# Patient Record
Sex: Female | Born: 1980 | Race: Asian | Hispanic: No | Marital: Married | Smoking: Never smoker
Health system: Southern US, Community
[De-identification: ages and names within clinical notes are randomized; demographics above are authoritative.]

## PROBLEM LIST (undated history)

## (undated) DIAGNOSIS — E039 Hypothyroidism, unspecified: Secondary | ICD-10-CM

## (undated) DIAGNOSIS — T7840XA Allergy, unspecified, initial encounter: Secondary | ICD-10-CM

## (undated) DIAGNOSIS — K219 Gastro-esophageal reflux disease without esophagitis: Secondary | ICD-10-CM

## (undated) HISTORY — DX: Allergy, unspecified, initial encounter: T78.40XA

## (undated) HISTORY — DX: Gastro-esophageal reflux disease without esophagitis: K21.9

## (undated) HISTORY — DX: Hypothyroidism, unspecified: E03.9

---

## 2011-05-28 LAB — HM PAP SMEAR

## 2013-09-07 ENCOUNTER — Other Ambulatory Visit: Payer: BC Managed Care – PPO

## 2013-09-07 ENCOUNTER — Ambulatory Visit (INDEPENDENT_AMBULATORY_CARE_PROVIDER_SITE_OTHER): Payer: BC Managed Care – PPO | Admitting: Internal Medicine

## 2013-09-07 ENCOUNTER — Encounter: Payer: Self-pay | Admitting: Internal Medicine

## 2013-09-07 VITALS — BP 112/70 | HR 66 | Temp 98.0°F | Resp 14 | Ht 64.0 in | Wt 147.0 lb

## 2013-09-07 DIAGNOSIS — Z0000 Encounter for general adult medical examination without abnormal findings: Secondary | ICD-10-CM | POA: Insufficient documentation

## 2013-09-07 DIAGNOSIS — Z124 Encounter for screening for malignant neoplasm of cervix: Secondary | ICD-10-CM

## 2013-09-07 DIAGNOSIS — E039 Hypothyroidism, unspecified: Secondary | ICD-10-CM

## 2013-09-07 NOTE — Progress Notes (Signed)
Patient ID: Colleen Henderson, female   DOB: 17-Feb-1981, 33 y.o.   MRN: [id number]    Chief Complaint  Patient presents with  . Establish Care    new patient, physical   No Known Allergies  HPI 33 y/o female patient is here to establish care. She was living in [place] and getting her medical care there. She has not seen a doctor in the [place] and is due for a physical exam. She has not had a pap smear in > 1 year She has been having hair loss and this concerns her During pregnancy patient has hypothyroidism noted and was on levothyroxine  Review of Systems  Constitutional: Negative for fever, chills, weight loss, malaise/fatigue and diaphoresis.  HENT: Negative for congestion, hearing loss and sore throat.   Eyes: Negative for blurred vision, double vision and discharge.  Respiratory: Negative for cough, sputum production, shortness of breath and wheezing.   Cardiovascular: Negative for chest pain, palpitations, orthopnea and leg swelling.  Gastrointestinal: Negative for heartburn, nausea, vomiting, abdominal pain, diarrhea and constipation.  Genitourinary: Negative for dysuria, urgency, frequency and flank pain.  Musculoskeletal: Negative for back pain, falls, joint pain and myalgias.  Skin: Negative for itching and rash.  Neurological: Negative for dizziness, tingling, focal weakness and headaches.  Psychiatric/Behavioral: Negative for depression and memory loss. The patient is not nervous/anxious.    History reviewed. No pertinent past medical history.  History reviewed. No pertinent past surgical history.  Family History  Problem Relation Age of Onset  . Diabetes Mother   . Diabetes Father    Not on any medications  History   Social History  . Marital Status: Married    Spouse Name: N/A    Number of Children: N/A  . Years of Education: N/A   Occupational History  . Not on file.   Social History Main Topics  . Smoking status: Never Smoker   . Smokeless tobacco: Never Used    . Alcohol Use: No  . Drug Use: No  . Sexual Activity: Not on file   Other Topics Concern  . Not on file   Social History Narrative  . No narrative on file   Physical exam BP 112/70  Pulse 66  Temp(Src) 98 F (36.7 C) (Oral)  Resp 14  Ht 5\' 4"  (1.626 m)  Wt 147 lb (66.679 kg)  BMI 25.22 kg/m2  SpO2 99%  LMP 08/08/2013  General- elderly female in no acute distress Head- atraumatic, normocephalic Eyes- PERRLA, EOMI, no pallor, no icterus, no discharge Ears- left ear normal tympanic membrane and normal external ear canal , right ear normal tympanic membrane and normal external ear canal Neck- no lymphadenopathy, no thyromegaly, no jugular vein distension, no carotid bruit Nose- normal nasaal mucosa, no maxillary sinus tenderness, no frontal sinus tenderness Mouth- normal mucus membrane, no oral thrush, normal oropharynx Chest- no chest wall deformities, no chest wall tenderness Breast- no masses, no palpable lumps, normal nipple and areola exam, no axillary lymphadenopathy Cardiovascular- normal s1,s2, no murmurs/ rubs/ gallops, normal distal pulses Respiratory- bilateral clear to auscultation, no wheeze, no rhonchi, no crackles Abdomen- bowel sounds present, soft, non tender, no organomegaly, no abdominal bruits, no guarding or rigidity, no CVA tenderness Pelvic exam- normal pelvic exam Musculoskeletal- able to move all 4 extremities, no spinal and paraspinal tenderness, steady gait, normal range of motion, no leg edema Neurological- no focal deficit, normal reflexes, normal muscle strength, normal sensation to fine touch and vibration Skin- warm and dry Psychiatry- alert and  oriented to person, place and time, normal mood and affect  Labs- 09-24-12 tsh 7.256, hb 12.5, plt 388  Assessment/plan  1. Pap smear for cervical cancer screening - PAP, IG w/ Reflex HPV when ASCU [[facility], [facility]]  2. Routine general medical examination at a health care facility Normal overall  exam. Routine labs ordered. the patient was counseled regarding regular self-examination of the breasts on a monthly basis, prevention of dental and periodontal disease, diet, regular sustained exercise for at least 30 minutes 5 times per week, the proper use of sunscreen and protective clothing. - CMP; Future - CBC with Differential; Future - Lipid Panel; Future  3. Unspecified hypothyroidism Check thyroid level prior to next visit - TSH; Future

## 2013-09-09 LAB — PAP IG W/ RFLX HPV ASCU: PAP SMEAR COMMENT: 0

## 2013-09-13 ENCOUNTER — Other Ambulatory Visit: Payer: BC Managed Care – PPO

## 2013-09-13 DIAGNOSIS — E039 Hypothyroidism, unspecified: Secondary | ICD-10-CM

## 2013-09-13 DIAGNOSIS — Z0000 Encounter for general adult medical examination without abnormal findings: Secondary | ICD-10-CM

## 2013-09-14 LAB — COMPREHENSIVE METABOLIC PANEL
A/G RATIO: 2 (ref 1.1–2.5)
ALT: 19 IU/L (ref 0–32)
AST: 19 IU/L (ref 0–40)
Albumin: 4.7 g/dL (ref 3.5–5.5)
Alkaline Phosphatase: 92 IU/L (ref 39–117)
BUN/Creatinine Ratio: 11 (ref 8–20)
BUN: 8 mg/dL (ref 6–20)
CHLORIDE: 102 mmol/L (ref 97–108)
CO2: 24 mmol/L (ref 18–29)
Calcium: 10 mg/dL (ref 8.7–10.2)
Creatinine, Ser: 0.75 mg/dL (ref 0.57–1.00)
GFR calc Af Amer: 122 mL/min/{1.73_m2} (ref >59)
GFR calc non Af Amer: 106 mL/min/{1.73_m2} (ref >59)
GLUCOSE: 90 mg/dL (ref 65–99)
Globulin, Total: 2.3 g/dL (ref 1.5–4.5)
POTASSIUM: 5.4 mmol/L — AB (ref 3.5–5.2)
SODIUM: 143 mmol/L (ref 134–144)
TOTAL PROTEIN: 7 g/dL (ref 6.0–8.5)
Total Bilirubin: <0.2 mg/dL (ref 0.0–1.2)

## 2013-09-14 LAB — CBC WITH DIFFERENTIAL/PLATELET
BASOS ABS: 0 10*3/uL (ref 0.0–0.2)
Basos: 0 %
EOS: 2 %
Eosinophils Absolute: 0.1 10*3/uL (ref 0.0–0.4)
HEMATOCRIT: 39.7 % (ref 34.0–46.6)
Hemoglobin: 13 g/dL (ref 11.1–15.9)
IMMATURE GRANULOCYTES: 0 %
Immature Grans (Abs): 0 10*3/uL (ref 0.0–0.1)
LYMPHS ABS: 2.1 10*3/uL (ref 0.7–3.1)
LYMPHS: 37 %
MCH: 28.1 pg (ref 26.6–33.0)
MCHC: 32.7 g/dL (ref 31.5–35.7)
MCV: 86 fL (ref 79–97)
MONOCYTES: 12 %
Monocytes Absolute: 0.7 10*3/uL (ref 0.1–0.9)
Neutrophils Absolute: 2.8 10*3/uL (ref 1.4–7.0)
Neutrophils Relative %: 49 %
RBC: 4.63 x10E6/uL (ref 3.77–5.28)
RDW: 13.5 % (ref 12.3–15.4)
WBC: 5.7 10*3/uL (ref 3.4–10.8)

## 2013-09-14 LAB — LIPID PANEL
CHOLESTEROL TOTAL: 130 mg/dL (ref 100–199)
Chol/HDL Ratio: 3.2 ratio units (ref 0.0–4.4)
HDL: 41 mg/dL (ref >39)
LDL Calculated: 74 mg/dL (ref 0–99)
TRIGLYCERIDES: 75 mg/dL (ref 0–149)
VLDL Cholesterol Cal: 15 mg/dL (ref 5–40)

## 2013-09-14 LAB — TSH: TSH: 12.57 u[IU]/mL — ABNORMAL HIGH (ref 0.450–4.500)

## 2013-09-16 ENCOUNTER — Encounter: Payer: Self-pay | Admitting: *Deleted

## 2013-09-22 ENCOUNTER — Ambulatory Visit (INDEPENDENT_AMBULATORY_CARE_PROVIDER_SITE_OTHER): Payer: BC Managed Care – PPO | Admitting: Internal Medicine

## 2013-09-22 ENCOUNTER — Encounter: Payer: Self-pay | Admitting: Internal Medicine

## 2013-09-22 VITALS — BP 104/60 | HR 61 | Temp 97.8°F | Wt 146.6 lb

## 2013-09-22 DIAGNOSIS — E039 Hypothyroidism, unspecified: Secondary | ICD-10-CM

## 2013-09-22 HISTORY — DX: Hypothyroidism, unspecified: E03.9

## 2013-09-22 MED ORDER — LEVOTHYROXINE SODIUM 50 MCG PO TABS: 50.0000 ug | ORAL_TABLET | Freq: Every day | ORAL | Status: DC

## 2013-09-22 NOTE — Progress Notes (Signed)
Patient ID: Colleen Henderson, female   DOB: [birth year], 33 y.o.   MRN: [id number]    Chief Complaint  Patient presents with  . Follow-up    f/u & discuss thyroid labs   No Known Allergies  HPI 33 y/o female patient is here for follow up on her thyroid lab. Her routine lab showed her tsh to be > 12 With normal t3 and t4. She has been having low energy level bit no constipation. She does not exercise on regular basis. Has been having hair loss but no other concerns  ROS No fever or chills No nausea or vomiting No abdominal pain, diarrhea or constipation Has hx of hypothyroidism during pregnancy where she was on levothyroxine 75 mcg and it was stopped post delivery for unclear reason  History reviewed. No pertinent past medical history. No current outpatient prescriptions on file prior to visit.   No current facility-administered medications on file prior to visit.   History reviewed. No pertinent past surgical history.  Physical exam BP 104/60  Pulse 61  Temp(Src) 97.8 F (36.6 C) (Oral)  Wt 146 lb 9.6 oz (66.497 kg)  SpO2 99%  LMP 08/24/2013  gen- adult female in NAD HEENT- no palpable enlargement of thyroid gland, no lymphadenopathy cvs- ns 1,s2, rrr respi- CTAB abdo- bowel sound present, soft, non tender Ext- able to move all 4, adequate strength, no edema  Labs- Lab Results  Component Value Date   TSH 12.570* 09/13/2013   T3TOTAL 116 09/13/2013   T4TOTAL 7.4 09/13/2013   Assessment/plan  1. Hypothyroidism Will start her on levothyroxine 50 mcg daily for now and recheck thyroid panel in 6- 8 weeks. - Thyroid Panel With TSH; Future - total and free t4 and t3 with TPO

## 2013-09-23 ENCOUNTER — Other Ambulatory Visit: Payer: Self-pay | Admitting: *Deleted

## 2013-09-23 MED ORDER — LEVOTHYROXINE SODIUM 50 MCG PO TABS: ORAL_TABLET | ORAL | Status: DC

## 2013-09-29 LAB — SPECIMEN STATUS REPORT

## 2013-09-30 LAB — T3: T3 TOTAL: 116 ng/dL (ref 71–180)

## 2013-09-30 LAB — SPECIMEN STATUS REPORT

## 2013-09-30 LAB — T4: T4 TOTAL: 7.4 ug/dL (ref 4.5–12.0)

## 2013-11-23 ENCOUNTER — Other Ambulatory Visit: Payer: BC Managed Care – PPO

## 2013-11-30 ENCOUNTER — Other Ambulatory Visit: Payer: BC Managed Care – PPO

## 2013-11-30 DIAGNOSIS — E039 Hypothyroidism, unspecified: Secondary | ICD-10-CM

## 2013-12-01 LAB — THYROID PANEL WITH TSH
FREE THYROXINE INDEX: 2.7 (ref 1.2–4.9)
T3 Uptake Ratio: 28 % (ref 24–39)
T4 TOTAL: 9.5 ug/dL (ref 4.5–12.0)
TSH: 5.18 u[IU]/mL — AB (ref 0.450–4.500)

## 2013-12-01 LAB — THYROID PEROXIDASE ANTIBODY: Thyroid Peroxidase Ab: 255 IU/mL — ABNORMAL HIGH (ref 0–34)

## 2013-12-01 LAB — T4, FREE: Free T4: 1.53 ng/dL (ref 0.82–1.77)

## 2013-12-01 LAB — T3, FREE: T3, Free: 3.3 pg/mL (ref 2.0–4.4)

## 2013-12-01 LAB — T3: T3, Total: 95 ng/dL (ref 71–180)

## 2013-12-07 ENCOUNTER — Encounter: Payer: Self-pay | Admitting: *Deleted

## 2014-07-19 ENCOUNTER — Encounter: Payer: Self-pay | Admitting: Internal Medicine

## 2014-09-13 ENCOUNTER — Encounter: Payer: BC Managed Care – PPO | Admitting: Internal Medicine

## 2014-09-23 ENCOUNTER — Encounter: Payer: Self-pay | Admitting: Internal Medicine

## 2014-09-30 ENCOUNTER — Encounter: Payer: Self-pay | Admitting: Internal Medicine

## 2014-10-26 ENCOUNTER — Ambulatory Visit (INDEPENDENT_AMBULATORY_CARE_PROVIDER_SITE_OTHER): Payer: PRIVATE HEALTH INSURANCE | Admitting: Internal Medicine

## 2014-10-26 ENCOUNTER — Encounter: Payer: Self-pay | Admitting: Internal Medicine

## 2014-10-26 ENCOUNTER — Other Ambulatory Visit: Payer: Self-pay

## 2014-10-26 VITALS — BP 110/80 | HR 68 | Temp 97.5°F | Resp 18 | Ht 64.0 in | Wt 138.0 lb

## 2014-10-26 DIAGNOSIS — Z0000 Encounter for general adult medical examination without abnormal findings: Secondary | ICD-10-CM | POA: Diagnosis not present

## 2014-10-26 DIAGNOSIS — E034 Atrophy of thyroid (acquired): Secondary | ICD-10-CM

## 2014-10-26 DIAGNOSIS — R2231 Localized swelling, mass and lump, right upper limb: Secondary | ICD-10-CM | POA: Diagnosis not present

## 2014-10-26 DIAGNOSIS — E038 Other specified hypothyroidism: Secondary | ICD-10-CM

## 2014-10-26 NOTE — Progress Notes (Signed)
Patient ID: Colleen Henderson, female   DOB: 07/07/1980, 34 y.o.   MRN: [id number]    Location:    [facility]   Place of Service:   OFFICE   Advanced Directive information  NONE  Chief Complaint  Patient presents with  . Annual Exam    Annaul exam    HPI:  34 yo female seen today for annual exam. She has no concerns. Plans to travel to [place] this weekend for 6 weeks. no further issues with hair loss. Daughter and spouse present today.  No thyroid sx's. Taking synthroid as Rx but ran out last week. Needs new rx   History reviewed. No pertinent past medical history.  History reviewed. No pertinent past surgical history.  Patient Care Team: Oneal GroutMahima Pandey, MD as PCP - General (Internal Medicine)  History   Social History  . Marital Status: Married    Spouse Name: N/A  . Number of Children: N/A  . Years of Education: N/A   Occupational History  . Not on file.   Social History Main Topics  . Smoking status: Never Smoker   . Smokeless tobacco: Never Used  . Alcohol Use: No  . Drug Use: No  . Sexual Activity: Not on file   Other Topics Concern  . Not on file   Social History Narrative     reports that she has never smoked. She has never used smokeless tobacco. She reports that she does not drink alcohol or use illicit drugs.  Family History  Problem Relation Age of Onset  . Diabetes Mother   . Diabetes Father    Family Status  Relation Status Death Age  . Mother Alive   . Father Alive   . Sister Alive   . Daughter Alive   . Daughter Alive     Immunization History  Administered Date(s) Administered  . Tdap 11/24/2012    No Known Allergies  Medications: Patient's Medications  New Prescriptions   No medications on file  Previous Medications   LEVOTHYROXINE (SYNTHROID, LEVOTHROID) 50 MCG TABLET    Take one tablet by mouth once daily 30 minutes before breakfast for thyroid  Modified Medications   No medications on file  Discontinued Medications   No  medications on file    Review of Systems  Constitutional: Negative for fever, chills, diaphoresis, activity change, appetite change and fatigue.  HENT: Negative for ear pain and sore throat.   Eyes: Negative for visual disturbance.  Respiratory: Negative for cough, chest tightness and shortness of breath.   Cardiovascular: Negative for chest pain, palpitations and leg swelling.  Gastrointestinal: Negative for nausea, vomiting, abdominal pain, diarrhea, constipation and blood in stool.  Endocrine: Negative for cold intolerance, heat intolerance, polydipsia, polyphagia and polyuria.  Genitourinary: Negative for dysuria, menstrual problem and pelvic pain.  Musculoskeletal: Negative for arthralgias.  Skin: Negative for rash.       Right axillary lump that has increased in size x 2 yrs. Began shortly after delivery of child  Neurological: Negative for dizziness, tremors, numbness and headaches.  Psychiatric/Behavioral: Negative for sleep disturbance. The patient is not nervous/anxious.     Filed Vitals:   10/26/14 0946  BP: 110/80  Pulse: 68  Temp: 97.5 F (36.4 C)  TempSrc: Oral  Resp: 18  Height: 5\' 4"  (1.626 m)  Weight: 138 lb (62.596 kg)  SpO2: 99%   Body mass index is 23.68 kg/(m^2).  Physical Exam  Constitutional: She is oriented to person, place, and time. She appears well-developed and  well-nourished. No distress.  HENT:  Head: Normocephalic and atraumatic.  Right Ear: Hearing, tympanic membrane, external ear and ear canal normal.  Left Ear: Hearing, tympanic membrane, external ear and ear canal normal.  Mouth/Throat: Uvula is midline, oropharynx is clear and moist and mucous membranes are normal. She does not have dentures.  Eyes: Conjunctivae, EOM and lids are normal. Pupils are equal, round, and reactive to light. No scleral icterus.  Neck: Trachea normal and normal range of motion. Neck supple. Carotid bruit is not present. No thyroid mass and no thyromegaly present.    Cardiovascular: Normal rate, regular rhythm, normal heart sounds and intact distal pulses.  Exam reveals no gallop and no friction rub.   No murmur heard. No carotid bruit b/l. No LE edema b/l. No calf TTP.   Pulmonary/Chest: Effort normal and breath sounds normal. She has no wheezes. She has no rhonchi. She has no rales. Right breast exhibits no inverted nipple, no mass, no nipple discharge, no skin change and no tenderness. Left breast exhibits no inverted nipple, no mass, no nipple discharge, no skin change and no tenderness. Breasts are symmetrical.    Abdominal: Soft. Normal appearance, normal aorta and bowel sounds are normal. She exhibits no pulsatile midline mass and no mass. There is no hepatosplenomegaly. There is no tenderness. There is no rigidity, no rebound and no guarding. No hernia.  Genitourinary: No breast swelling, tenderness, discharge or bleeding.  Musculoskeletal: Normal range of motion.  Lymphadenopathy:       Head (right side): No posterior auricular adenopathy present.       Head (left side): No posterior auricular adenopathy present.    She has no cervical adenopathy.       Right: No supraclavicular adenopathy present.       Left: No supraclavicular adenopathy present.  Neurological: She is alert and oriented to person, place, and time. She has normal strength and normal reflexes. No cranial nerve deficit. Gait normal.  Skin: Skin is warm, dry and intact. No rash noted. Nails show no clubbing.  Psychiatric: She has a normal mood and affect. Her speech is normal and behavior is normal. Thought content normal. Cognition and memory are normal.     Labs reviewed: No visits with results within 3 Month(s) from this visit. Latest known visit with results is:  Appointment on 11/30/2013  Component Date Value Ref Range Status  . TSH 11/30/2013 5.180* 0.450 - 4.500 uIU/mL Final  . T4, Total 11/30/2013 9.5  4.5 - 12.0 ug/dL Final  . T3 Uptake Ratio 11/30/2013 28  24 - 39  % Final  . Free Thyroxine Index 11/30/2013 2.7  1.2 - 4.9 Final  . T3, Total 11/30/2013 95  71 - 180 ng/dL Final  . T3, Free 16/02/9603 3.3  2.0 - 4.4 pg/mL Final  . Free T4 11/30/2013 1.53  0.82 - 1.77 ng/dL Final  . Thyroid Peroxidase Ab 11/30/2013 255* 0 - 34 IU/mL Final    No results found.   Assessment/Plan   ICD-9-CM ICD-10-CM   1. Routine general medical examination at a health care facility V70.0 Z00.00 CBC with Differential     CMP     Lipid Panel     Urinalysis with Reflex Microscopic  2. Axillary lump, right - needs further w/u 782.2 R22.31 US BREAST LTD UNI RIGHT INC AXILLA     MM DIAG BREAST TOMO BILATERAL  3. Hypothyroidism due to acquired atrophy of thyroid 244.8 E03.8 TSH   246.8 E03.4 T4, Free  Pt is UTD on health maintenance. Vaccinations are UTD. Pt maintains a healthy lifestyle. Encouraged pt to exercise 30-45 minutes 4-5 times per week. Eat a well balanced diet. Avoid smoking. Limit alcohol intake. Wear seatbelt when riding in the car. Wear sun block (SPF >50) when spending extended times outside.  Will call with Ultrasound appointment  Follow up in 1 year for CPE  [facility] [address]sey  [facility] [address] [place], [place] [address] [phone] Cell (Monday-Friday 8 AM - 5 PM) [phone] After 5 PM and follow prompts

## 2014-10-26 NOTE — Patient Instructions (Signed)
Encouraged her to exercise 30-45 minutes 4-5 times per week. Eat a well balanced diet. Avoid smoking. Limit alcohol intake. Wear seatbelt when riding in the car. Wear sun block (SPF >50) when spending extended times outside.   Will call with Ultrasound appointment  Follow up in 1 year for CPE

## 2014-10-27 ENCOUNTER — Other Ambulatory Visit: Payer: Self-pay | Admitting: *Deleted

## 2014-10-27 ENCOUNTER — Encounter: Payer: Self-pay | Admitting: *Deleted

## 2014-10-27 DIAGNOSIS — E039 Hypothyroidism, unspecified: Secondary | ICD-10-CM

## 2014-10-27 LAB — CBC WITH DIFFERENTIAL/PLATELET
Basophils Absolute: 0 10*3/uL (ref 0.0–0.2)
Basos: 0 %
EOS (ABSOLUTE): 0.1 10*3/uL (ref 0.0–0.4)
Eos: 2 %
HEMOGLOBIN: 11.8 g/dL (ref 11.1–15.9)
Hematocrit: 36.2 % (ref 34.0–46.6)
IMMATURE GRANS (ABS): 0 10*3/uL (ref 0.0–0.1)
Immature Granulocytes: 0 %
Lymphocytes Absolute: 2.4 10*3/uL (ref 0.7–3.1)
Lymphs: 39 %
MCH: 27.8 pg (ref 26.6–33.0)
MCHC: 32.6 g/dL (ref 31.5–35.7)
MCV: 85 fL (ref 79–97)
Monocytes Absolute: 0.3 10*3/uL (ref 0.1–0.9)
Monocytes: 6 %
NEUTROS PCT: 53 %
Neutrophils Absolute: 3.3 10*3/uL (ref 1.4–7.0)
PLATELETS: 415 10*3/uL — AB (ref 150–379)
RBC: 4.24 x10E6/uL (ref 3.77–5.28)
RDW: 14.8 % (ref 12.3–15.4)
WBC: 6.2 10*3/uL (ref 3.4–10.8)

## 2014-10-27 LAB — COMPREHENSIVE METABOLIC PANEL
A/G RATIO: 2.1 (ref 1.1–2.5)
ALT: 14 IU/L (ref 0–32)
AST: 16 IU/L (ref 0–40)
Albumin: 4.6 g/dL (ref 3.5–5.5)
Alkaline Phosphatase: 80 IU/L (ref 39–117)
BUN/Creatinine Ratio: 14 (ref 8–20)
BUN: 9 mg/dL (ref 6–20)
Bilirubin Total: 0.3 mg/dL (ref 0.0–1.2)
CO2: 24 mmol/L (ref 18–29)
CREATININE: 0.63 mg/dL (ref 0.57–1.00)
Calcium: 9.2 mg/dL (ref 8.7–10.2)
Chloride: 101 mmol/L (ref 97–108)
GFR, EST AFRICAN AMERICAN: 136 mL/min/{1.73_m2} (ref >59)
GFR, EST NON AFRICAN AMERICAN: 118 mL/min/{1.73_m2} (ref >59)
Globulin, Total: 2.2 g/dL (ref 1.5–4.5)
Glucose: 86 mg/dL (ref 65–99)
Potassium: 4.5 mmol/L (ref 3.5–5.2)
Sodium: 138 mmol/L (ref 134–144)
Total Protein: 6.8 g/dL (ref 6.0–8.5)

## 2014-10-27 LAB — URINALYSIS, ROUTINE W REFLEX MICROSCOPIC
BILIRUBIN UA: NEGATIVE
Glucose, UA: NEGATIVE
KETONES UA: NEGATIVE
Leukocytes, UA: NEGATIVE
Nitrite, UA: NEGATIVE
PROTEIN UA: NEGATIVE
RBC, UA: NEGATIVE
Specific Gravity, UA: 1.009 (ref 1.005–1.030)
Urobilinogen, Ur: 0.2 mg/dL (ref 0.2–1.0)
pH, UA: 6 (ref 5.0–7.5)

## 2014-10-27 LAB — T4, FREE: FREE T4: 1.06 ng/dL (ref 0.82–1.77)

## 2014-10-27 LAB — LIPID PANEL
CHOL/HDL RATIO: 2.9 ratio (ref 0.0–4.4)
CHOLESTEROL TOTAL: 128 mg/dL (ref 100–199)
HDL: 44 mg/dL (ref >39)
LDL CALC: 69 mg/dL (ref 0–99)
TRIGLYCERIDES: 74 mg/dL (ref 0–149)
VLDL Cholesterol Cal: 15 mg/dL (ref 5–40)

## 2014-10-27 LAB — TSH: TSH: 9.99 u[IU]/mL — AB (ref 0.450–4.500)

## 2014-10-27 MED ORDER — LEVOTHYROXINE SODIUM 50 MCG PO TABS: ORAL_TABLET | ORAL | Status: DC

## 2014-12-06 ENCOUNTER — Encounter: Payer: Self-pay | Admitting: Internal Medicine

## 2014-12-14 ENCOUNTER — Telehealth: Payer: Self-pay | Admitting: Internal Medicine

## 2014-12-14 ENCOUNTER — Encounter: Payer: Self-pay | Admitting: Internal Medicine

## 2014-12-14 NOTE — Telephone Encounter (Signed)
[facility] tried to contact the patient several times to schedule her appointment for her Mammogram & Ultrasound  I have tried calling the patient myself to follow up but her number is now disconnected   Letter sent to patient today

## 2014-12-19 NOTE — Telephone Encounter (Signed)
Noted  

## 2015-11-01 ENCOUNTER — Encounter: Payer: Self-pay | Admitting: Internal Medicine

## 2015-11-01 ENCOUNTER — Ambulatory Visit (INDEPENDENT_AMBULATORY_CARE_PROVIDER_SITE_OTHER): Payer: Managed Care, Other (non HMO) | Admitting: Internal Medicine

## 2015-11-01 VITALS — BP 122/72 | HR 54 | Temp 98.1°F | Ht 64.0 in | Wt 133.6 lb

## 2015-11-01 DIAGNOSIS — E034 Atrophy of thyroid (acquired): Secondary | ICD-10-CM

## 2015-11-01 DIAGNOSIS — Z0000 Encounter for general adult medical examination without abnormal findings: Secondary | ICD-10-CM

## 2015-11-01 DIAGNOSIS — L309 Dermatitis, unspecified: Secondary | ICD-10-CM | POA: Insufficient documentation

## 2015-11-01 DIAGNOSIS — Z124 Encounter for screening for malignant neoplasm of cervix: Secondary | ICD-10-CM | POA: Diagnosis not present

## 2015-11-01 DIAGNOSIS — E038 Other specified hypothyroidism: Secondary | ICD-10-CM | POA: Diagnosis not present

## 2015-11-01 MED ORDER — METHYLPREDNISOLONE ACETATE 40 MG/ML IJ SUSP
40.0000 mg | Freq: Once | INTRAMUSCULAR | Status: AC
  Administered 2015-11-01: 40 mg via INTRAMUSCULAR

## 2015-11-01 MED ORDER — TRIAMCINOLONE ACETONIDE 0.1 % EX LOTN: 1.0000 "application " | TOPICAL_LOTION | Freq: Two times a day (BID) | CUTANEOUS | Status: DC | PRN

## 2015-11-01 MED ORDER — PREDNISONE 10 MG PO TABS: ORAL_TABLET | ORAL | Status: DC

## 2015-11-01 NOTE — Progress Notes (Signed)
Patient ID: Colleen Henderson, female   DOB: 07-13-1980, 35 y.o.   MRN: [id number] Subjective:     Colleen Henderson is a 35 y.o. female and is here for a comprehensive physical exam. The patient reports problems - skin rash x 1 month. Skin is dry and itchy on extremities upper and lower. Applying cetirizine ointment without relief. She has not tried zyrtec or benadryl  Hypothyroid - takes levothyroxine  [facility]. She has menses monthly. No issues. She does not perform self breast exams  Depression screen [facility] 2/9 11/01/2015 09/07/2013  Decreased Interest 0 0  Down, Depressed, Hopeless 0 0  PHQ - 2 Score 0 0    Fall Risk  11/01/2015 10/26/2014  Falls in the past year? No No     Past Medical History  Diagnosis Date  . Hypothyroidism 09/22/2013   History reviewed. No pertinent past surgical history. - denies any surgeries  Social History   Social History  . Marital Status: Married    Spouse Name: N/A  . Number of Children: N/A  . Years of Education: N/A   Occupational History  . Not on file.   Social History Main Topics  . Smoking status: Never Smoker   . Smokeless tobacco: Never Used  . Alcohol Use: No  . Drug Use: No  . Sexual Activity: Not on file   Other Topics Concern  . Not on file   Social History Narrative   Health Maintenance  Topic Date Due  . HIV Screening  08/08/1996  . INFLUENZA VACCINE  12/26/2015  . PAP SMEAR  09/07/2016  . TETANUS/TDAP  11/25/2022    Family History  Problem Relation Age of Onset  . Diabetes Mother   . Diabetes Father     Current Outpatient Prescriptions on File Prior to Visit  Medication Sig Dispense Refill  . levothyroxine (SYNTHROID, LEVOTHROID) 50 MCG tablet Take one tablet by mouth once daily 30 minutes before breakfast for thyroid 90 tablet 3   No current facility-administered medications on file prior to visit.     Review of Systems   Review of Systems  Constitutional: Negative for fever, chills and malaise/fatigue.   HENT: Negative for sore throat and tinnitus.   Eyes: Negative for blurred vision and double vision.  Respiratory: Negative for cough, shortness of breath and wheezing.   Cardiovascular: Negative for chest pain, palpitations, orthopnea and leg swelling.  Gastrointestinal: Negative for heartburn, nausea, vomiting, abdominal pain, diarrhea, constipation and blood in stool.  Genitourinary: Negative for dysuria, urgency, frequency and hematuria.  Musculoskeletal: Negative for myalgias, joint pain and falls.  Skin: Positive for itching and rash.  Neurological: Negative for dizziness, tingling, tremors, sensory change, focal weakness, seizures, loss of consciousness, weakness and headaches.  Endo/Heme/Allergies: Negative for environmental allergies. Does not bruise/bleed easily.       (+) cold intolerance  Psychiatric/Behavioral: Negative for depression and memory loss. The patient is not nervous/anxious and does not have insomnia.      Objective:      Physical Exam  Constitutional: She is oriented to person, place, and time and well-developed, well-nourished, and in no distress.  HENT:  Head: Normocephalic and atraumatic.  Right Ear: External ear normal.  Left Ear: External ear normal.  Mouth/Throat: Oropharynx is clear and moist. No oropharyngeal exudate.  Eyes: Conjunctivae and EOM are normal. Pupils are equal, round, and reactive to light. No scleral icterus.  Neck: Normal range of motion. Neck supple. Carotid bruit is not present. No tracheal deviation  present. Thyromegaly (mild) present.  Cardiovascular: Normal rate, regular rhythm, normal heart sounds and intact distal pulses.  Exam reveals no gallop and no friction rub.   No murmur heard. Pulmonary/Chest: Effort normal and breath sounds normal. She has no wheezes. She has no rhonchi. She has no rales. She exhibits no tenderness. Right breast exhibits no inverted nipple, no mass, no nipple discharge, no skin change and no tenderness.  Left breast exhibits no inverted nipple, no mass, no nipple discharge, no skin change and no tenderness. Breasts are symmetrical.  Abdominal: Soft. Bowel sounds are normal. She exhibits no distension and no mass. There is no hepatosplenomegaly. There is no tenderness. There is no rebound and no guarding.  Genitourinary: Vagina normal, uterus normal, cervix normal, right adnexa normal, left adnexa normal and vulva normal. Rectal exam shows no external hemorrhoid. No vaginal discharge found.  Musculoskeletal: Normal range of motion.  Lymphadenopathy:    She has no cervical adenopathy.    She has no axillary adenopathy.       Right: No supraclavicular adenopathy present.       Left: No supraclavicular adenopathy present.  Neurological: She is alert and oriented to person, place, and time. She has normal reflexes. Gait normal.  Skin: Skin is warm and dry. Rash (dry patchy rash on back, extremities b/l. no vesicular formation. multiple excoriations. ) noted.  Psychiatric: Mood, memory, affect and judgment normal.      Assessment:    Healthy female exam.       ICD-9-CM ICD-10-CM   1. Well adult exam V70.0 Z00.00 CMP     Lipid Panel     Urinalysis with Reflex Microscopic     CBC with Differential/Platelets  2. Hypothyroidism due to acquired atrophy of thyroid 244.8 E03.8 TSH   246.8 E03.4 T4, Free  3. 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 Cervical cancer screening V76.2 Z12.4 PAP, Image Guided [[facility]]     Plan:     See After Visit Summary for Counseling Recommendations   Pt is UTD on health maintenance. Vaccinations are UTD. Pt maintains a healthy lifestyle. Encouraged pt to exercise 30-45 minutes 4-5 times per week. Eat a well balanced diet. Avoid smoking. Limit alcohol intake. Wear seatbelt when riding in the car. Wear sun block (SPF  >50) when spending extended times outside.  Pap smear done today  Once complete prednisone tabs, may use triamcinolone lotion prn to rash  START PREDNISONE TABS IN AM as directed.  Given depo-medrol injection today  Continue other medications as ordered  Will call with lab results  Follow up in 1 yr for CPE and as needed  [facility] [address]  [facility] [address] [place], [place] [address] [phone] Cell (Monday-Friday 8 AM - 5 PM) [phone] After 5 PM and follow prompts

## 2015-11-01 NOTE — Patient Instructions (Signed)
Encouraged her to exercise 30-45 minutes 4-5 times per week. Eat a well balanced diet. Avoid smoking. Limit alcohol intake. Wear seatbelt when riding in the car. Wear sun block (SPF >50) when spending extended times outside.  START PREDNISONE TABS IN AM as directed.  Given depo-medrol injection today  Continue other medications as ordered  Will call with lab results  Follow up in 1 yr for CPE and as needed

## 2015-11-02 ENCOUNTER — Other Ambulatory Visit: Payer: Self-pay

## 2015-11-02 LAB — LIPID PANEL
Chol/HDL Ratio: 2.6 ratio units (ref 0.0–4.4)
Cholesterol, Total: 137 mg/dL (ref 100–199)
HDL: 53 mg/dL (ref >39)
LDL Calculated: 69 mg/dL (ref 0–99)
Triglycerides: 75 mg/dL (ref 0–149)
VLDL Cholesterol Cal: 15 mg/dL (ref 5–40)

## 2015-11-02 LAB — CBC WITH DIFFERENTIAL/PLATELET
BASOS ABS: 0 10*3/uL (ref 0.0–0.2)
BASOS: 0 %
EOS (ABSOLUTE): 0.1 10*3/uL (ref 0.0–0.4)
Eos: 2 %
Hematocrit: 37.1 % (ref 34.0–46.6)
Hemoglobin: 12.5 g/dL (ref 11.1–15.9)
Immature Grans (Abs): 0 10*3/uL (ref 0.0–0.1)
Immature Granulocytes: 0 %
LYMPHS: 41 %
Lymphocytes Absolute: 2.2 10*3/uL (ref 0.7–3.1)
MCH: 28.7 pg (ref 26.6–33.0)
MCHC: 33.7 g/dL (ref 31.5–35.7)
MCV: 85 fL (ref 79–97)
MONOS ABS: 0.5 10*3/uL (ref 0.1–0.9)
Monocytes: 9 %
NEUTROS ABS: 2.5 10*3/uL (ref 1.4–7.0)
NEUTROS PCT: 48 %
PLATELETS: 386 10*3/uL — AB (ref 150–379)
RBC: 4.36 x10E6/uL (ref 3.77–5.28)
RDW: 14.2 % (ref 12.3–15.4)
WBC: 5.2 10*3/uL (ref 3.4–10.8)

## 2015-11-02 LAB — URINALYSIS, ROUTINE W REFLEX MICROSCOPIC
BILIRUBIN UA: NEGATIVE
GLUCOSE, UA: NEGATIVE
KETONES UA: NEGATIVE
Leukocytes, UA: NEGATIVE
NITRITE UA: NEGATIVE
Protein, UA: NEGATIVE
RBC UA: NEGATIVE
SPEC GRAV UA: 1.013 (ref 1.005–1.030)
Urobilinogen, Ur: 0.2 mg/dL (ref 0.2–1.0)
pH, UA: 7 (ref 5.0–7.5)

## 2015-11-02 LAB — COMPREHENSIVE METABOLIC PANEL
ALT: 14 IU/L (ref 0–32)
AST: 18 IU/L (ref 0–40)
Albumin/Globulin Ratio: 1.7 (ref 1.2–2.2)
Albumin: 4.5 g/dL (ref 3.5–5.5)
Alkaline Phosphatase: 65 IU/L (ref 39–117)
BUN/Creatinine Ratio: 11 (ref 9–23)
BUN: 7 mg/dL (ref 6–20)
CHLORIDE: 100 mmol/L (ref 96–106)
CO2: 23 mmol/L (ref 18–29)
Calcium: 9.3 mg/dL (ref 8.7–10.2)
Creatinine, Ser: 0.66 mg/dL (ref 0.57–1.00)
GFR calc Af Amer: 133 mL/min/{1.73_m2} (ref >59)
GFR calc non Af Amer: 116 mL/min/{1.73_m2} (ref >59)
GLOBULIN, TOTAL: 2.7 g/dL (ref 1.5–4.5)
Glucose: 87 mg/dL (ref 65–99)
Potassium: 3.9 mmol/L (ref 3.5–5.2)
SODIUM: 139 mmol/L (ref 134–144)
Total Protein: 7.2 g/dL (ref 6.0–8.5)

## 2015-11-02 LAB — T4, FREE: FREE T4: 1.49 ng/dL (ref 0.82–1.77)

## 2015-11-02 LAB — TSH: TSH: 7.38 u[IU]/mL — ABNORMAL HIGH (ref 0.450–4.500)

## 2015-11-02 MED ORDER — LEVOTHYROXINE SODIUM 75 MCG PO TABS: 75.0000 ug | ORAL_TABLET | Freq: Every day | ORAL | Status: DC

## 2015-11-02 NOTE — Telephone Encounter (Signed)
Medication was increased from 50 mcg to 75 mcg. New Rx was sent to the pharmacy.

## 2015-11-03 LAB — PAP IG (IMAGE GUIDED): PAP Smear Comment: 0

## 2016-04-26 HISTORY — PX: MOUTH SURGERY: SHX715

## 2016-11-01 ENCOUNTER — Encounter: Payer: Managed Care, Other (non HMO) | Admitting: Internal Medicine

## 2016-12-03 ENCOUNTER — Encounter: Payer: Managed Care, Other (non HMO) | Admitting: Internal Medicine

## 2016-12-10 ENCOUNTER — Encounter: Payer: Self-pay | Admitting: Internal Medicine

## 2016-12-10 ENCOUNTER — Ambulatory Visit (INDEPENDENT_AMBULATORY_CARE_PROVIDER_SITE_OTHER): Payer: Managed Care, Other (non HMO) | Admitting: Internal Medicine

## 2016-12-10 VITALS — BP 106/70 | HR 76 | Resp 12 | Ht 64.0 in | Wt 134.0 lb

## 2016-12-10 DIAGNOSIS — R195 Other fecal abnormalities: Secondary | ICD-10-CM | POA: Diagnosis not present

## 2016-12-10 DIAGNOSIS — R1030 Lower abdominal pain, unspecified: Secondary | ICD-10-CM

## 2016-12-10 DIAGNOSIS — L309 Dermatitis, unspecified: Secondary | ICD-10-CM

## 2016-12-10 DIAGNOSIS — Z0000 Encounter for general adult medical examination without abnormal findings: Secondary | ICD-10-CM | POA: Diagnosis not present

## 2016-12-10 DIAGNOSIS — E034 Atrophy of thyroid (acquired): Secondary | ICD-10-CM | POA: Diagnosis not present

## 2016-12-10 MED ORDER — TRIAMCINOLONE ACETONIDE 0.1 % EX LOTN: 1.0000 "application " | TOPICAL_LOTION | Freq: Two times a day (BID) | CUTANEOUS | 1 refills | Status: DC | PRN

## 2016-12-10 NOTE — Patient Instructions (Signed)
Return to office in next 1-2 weeks for fasting labs  Continue current medications as ordered  Follow up in 1 mo for loose stools  Keeping You Healthy  Get These Tests 1. Blood Pressure- Have your blood pressure checked once a year by your health care provider.  Normal blood pressure is 120/80. 2. Weight- Have your body mass index (BMI) calculated to screen for obesity.  BMI is measure of body fat based on height and weight.  You can also calculate your own BMI at https://www.west-esparza.com/www.nhlbisupport.com/bmi/. 3. Cholesterol- Have your cholesterol checked every 5 years starting at age 35 then yearly starting at age 35. 4. Chlamydia, HIV, and other sexually transmitted diseases- Get screened every year until age 35, then within three months of each new sexual provider. 5. Pap Test - Every 1-5 years; discuss with your health care provider. 6. Mammogram- Every 1-2 years starting at age 35--50  Take these medicines  Calcium with Vitamin D-Your body needs 1200 mg of Calcium each day and [phone] IU of Vitamin D daily.  Your body can only absorb 500 mg of Calcium at a time so Calcium must be taken in 2 or 3 divided doses throughout the day.  Multivitamin with folic acid- Once daily if it is possible for you to become pregnant.  Get these Immunizations  Gardasil-Series of three doses; prevents HPV related illness such as genital warts and cervical cancer.  Menactra-Single dose; prevents meningitis.  Tetanus shot- Every 10 years.  Flu shot-Every year.  Take these steps 1. Do not smoke-Your healthcare provider can help you quit.  For tips on how to quit go to www.smokefree.gov or call 1-800 QUITNOW. 2. Be physically active- Exercise 5 days a week for at least 30 minutes.  If you are not already physically active, start slow and gradually work up to 30 minutes of moderate physical activity.  Examples of moderate activity include walking briskly, dancing, swimming, bicycling, etc. 3. Breast Cancer- A self breast  exam every month is important for early detection of breast cancer.  For more information and instruction on self breast exams, ask your healthcare provider or [place].eswww.womenshealth.gov/faq/breast-self-exam.cfm. 4. Eat a healthy diet- Eat a variety of healthy foods such as fruits, vegetables, whole grains, low fat milk, low fat cheeses, yogurt, lean meats, poultry and fish, beans, nuts, tofu, etc.  For more information go to www. Thenutritionsource.org 5. Drink alcohol in moderation- Limit alcohol intake to one drink or less per day. Never drink and drive. 6. Depression- Your emotional health is as important as your physical health.  If you're feeling down or losing interest in things you normally enjoy please talk to your healthcare provider about being screened for depression. 7. Dental visit- Brush and floss your teeth twice daily; visit your dentist twice a year. 8. Eye doctor- Get an eye exam at least every 2 years. 9. Helmet use- Always wear a helmet when riding a bicycle, motorcycle, rollerblading or skateboarding. 10. Safe sex- If you may be exposed to sexually transmitted infections, use a condom. 11. Seat belts- Seat belts can save your live; always wear one. 12. Smoke/Carbon Monoxide detectors- These detectors need to be installed on the appropriate level of your home. Replace batteries at least once a year. 13. Skin cancer- When out in the sun please cover up and use sunscreen 15 SPF or higher. 14. Violence- If anyone is threatening or hurting you, please tell your healthcare provider.

## 2016-12-10 NOTE — Progress Notes (Signed)
Patient ID: Colleen Henderson, female   DOB: 01-17-1981, 36 y.o.   MRN: [id number]   Location:  [facility]  Place of Service:  OFFICE  Provider: Arletha Grippe, DO  Patient Care Team: Gildardo Cranker, DO as PCP - General (Internal Medicine)  Extended Emergency Contact Information Primary Emergency Contact: Klingler,Vishal Address: [address] [place]          [place], [place] [address] [place] Phone: [phone] Relation: Spouse Secondary Emergency Contact: Aggorwal,Tannu Address: [facility], [place] [address] [place] Phone: [phone] Relation: Friend  Code Status: FULL CODE Goals of Care: Advanced Directive information Advanced Directives 11/01/2015  Does Patient Have a Medical Advance Directive? No  Would patient like information on creating a medical advance directive? No - patient declined information     Chief Complaint  Patient presents with  . Annual Exam    Yearly check up, no pap this year (last year- normal). Examine ongoing skin rash- onset summer 2018 . Diarrhea episodes off/on in am(pain associated) , last episode this morning.   Marland Kitchen Health Maintenance    Will check with insurance about HIV screening coverage   . Medication Refill    Renew Trimnaclolne cream   . Labs Only    Discuss lab test to check vitamin levels     HPI: Patient is a 36 y.o. female seen in today for an annual wellness exam.  She c/o abdominal cramping pain x 1 mo that occurs early in the AM. No sx's after BM 2-3 times. No bloody stools but they are loose. No rectal pain.  Sx's unrelated to meals but appear to occur after she takes thyroid medicine. She has not noticed a change in pill color or shape from pharmacy. No N/V.   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 Current rash itchy and associated with "blood spots"  FDLMP November 30, 2016. She has menses monthly. No issues. She does not perform self breast exams. Last pap in 10/2015 neg for intraepithelial lesion/malignancy. She uses condoms consistently for barrier protection.   Depression screen [facility] 2/9 12/10/2016 11/01/2015 09/07/2013  Decreased Interest 0 0 0  Down, Depressed, Hopeless 0 0 0  PHQ - 2 Score 0 0 0    Fall Risk  12/10/2016 11/01/2015 10/26/2014  Falls in the past year? No No No   No flowsheet data found.   Health Maintenance  Topic Date Due  . HIV Screening  08/08/1996  . INFLUENZA VACCINE  12/25/2016  . PAP SMEAR  11/01/2018  . TETANUS/TDAP  11/25/2022    Past Medical History:  Diagnosis Date  . Hypothyroidism 09/22/2013    Past Surgical History:  Procedure Laterality Date  . MOUTH SURGERY  04/26/2016   Tooth Implant     Family History  Problem Relation Age of Onset  . Diabetes Mother   . Diabetes Father    Family Status  Relation Status  . Mother Alive  . Father Alive  . Sister Alive  . Daughter Alive  . Daughter Alive    Social History   Social History  . Marital status: Married    Spouse name: N/A  . Number of children: N/A  . Years of education:  N/A   Occupational History  . Not on file.   Social History Main Topics  . Smoking status: Never Smoker  . Smokeless tobacco: Never Used  . Alcohol use No  . Drug use: No  . Sexual activity: Not on file   Other Topics Concern  . Not on file   Social History Narrative  . No narrative on file    No Known Allergies  Allergies as of 12/10/2016   No Known Allergies     Medication List       Accurate as of 12/10/16  9:58 AM. Always use your most recent med list.          levothyroxine 75 MCG tablet Commonly known as:  SYNTHROID, LEVOTHROID Take 1 tablet (75 mcg total) by mouth daily before breakfast.   triamcinolone lotion 0.1 % Commonly known as:  KENALOG Apply 1 application topically 2 (two) times daily as needed. For rash        Review of Systems:    Review of Systems  Gastrointestinal: Positive for abdominal pain.  Skin: Positive for rash.  All other systems reviewed and are negative.   Physical Exam: Vitals:   12/10/16 0955  BP: 106/70  Pulse: 76  Resp: 12  Weight: 134 lb (60.8 kg)  Height: '5\' 4"'$  (1.626 m)   Body mass index is 23 kg/m. Physical Exam  Constitutional: She is oriented to person, place, and time. She appears well-developed and well-nourished. No distress.  HENT:  Head: Normocephalic and atraumatic.  Right Ear: External ear normal.  Left Ear: External ear normal.  Mouth/Throat: Oropharynx is clear and moist. No oropharyngeal exudate.  MMM; no oral thrush  Eyes: Pupils are equal, round, and reactive to light. EOM are normal. No scleral icterus.  Neck: Normal range of motion. Neck supple. Carotid bruit is not present. No tracheal deviation present. No thyromegaly present.  Cardiovascular: Normal rate, regular rhythm and intact distal pulses.  Exam reveals no gallop and no friction rub.   No murmur heard. No LE edema b/l. No calf TTP  Pulmonary/Chest: Effort normal and breath sounds normal. No respiratory distress. She has no wheezes. She has no rales. She exhibits no tenderness. Right breast exhibits no inverted nipple, no mass, no nipple discharge, no skin change and no tenderness. Left breast exhibits no inverted nipple, no mass, no nipple discharge, no skin change and no tenderness. Breasts are symmetrical.  No rhonchi  Abdominal: Soft. Bowel sounds are normal. She exhibits no distension and no mass. There is no hepatosplenomegaly or hepatomegaly. There is tenderness (R>L lower quadrant). There is no rebound and no guarding. No hernia.  Genitourinary: Rectum normal. Rectal exam shows no external hemorrhoid, no internal hemorrhoid, no fissure, no mass, no tenderness, anal tone normal and guaiac negative stool. No breast swelling, tenderness, discharge or bleeding. No vaginal discharge found.  Musculoskeletal: She  exhibits no deformity.  Lymphadenopathy:    She has no cervical adenopathy.  Neurological: She is alert and oriented to person, place, and time. She has normal reflexes.  Skin: Skin is warm and dry. No rash noted.  Psychiatric: She has a normal mood and affect. Her behavior is normal. Judgment normal.  Vitals reviewed.   Labs reviewed:  Basic Metabolic Panel: No results for input(s): NA, K, CL, CO2, GLUCOSE, BUN, CREATININE, CALCIUM, MG, PHOS, TSH in the last 8760 hours. Liver Function Tests: No results for input(s): AST, ALT, ALKPHOS, BILITOT, PROT, ALBUMIN in the last 8760 hours. No results for input(s):  LIPASE, AMYLASE in the last 8760 hours. No results for input(s): AMMONIA in the last 8760 hours. CBC: No results for input(s): WBC, NEUTROABS, HGB, HCT, MCV, PLT in the last 8760 hours. Lipid Panel: No results for input(s): CHOL, HDL, LDLCALC, TRIG, CHOLHDL, LDLDIRECT in the last 8760 hours. No results found for: HGBA1C  Procedures: No results found.  Assessment/Plan   ICD-10-CM   1. Well adult exam Z00.00 CMP with eGFR    Lipid Panel    CANCELED: CMP with eGFR    CANCELED: Lipid Panel    CANCELED: CBC with Differential/Platelets  2. Eczema, unspecified type L30.9 triamcinolone lotion (KENALOG) 0.1 %  3. 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 Loose stools R19.5 CBC with Differential/Platelets    Urinalysis with Reflex Microscopic    CANCELED: Urinalysis with Reflex Microscopic  5. Lower abdominal pain R10.30 CBC with Differential/Platelets    Urinalysis with Reflex Microscopic    CANCELED: Urinalysis with Reflex Microscopic     T/c GI w/u for loose stools if they continue - may need CT A/P w/wo, endoscopy, etc  Return to office in next 1-2 weeks for fasting labs  T/c making synthroid brand medically necessary  since sx's occur after she takes this med  Continue current medications as ordered  Follow up in 1 mo for loose stools  Keeping you healthy handout given  Cordella Register. Perlie Gold  [facility] and [facility] [address] [place], [place] [address] [phone] Cell (Monday-Friday 8 AM - 5 PM) [phone] After 5 PM and follow prompts

## 2016-12-17 ENCOUNTER — Other Ambulatory Visit: Payer: Managed Care, Other (non HMO)

## 2016-12-17 DIAGNOSIS — R1030 Lower abdominal pain, unspecified: Secondary | ICD-10-CM

## 2016-12-17 DIAGNOSIS — Z0000 Encounter for general adult medical examination without abnormal findings: Secondary | ICD-10-CM

## 2016-12-17 DIAGNOSIS — E034 Atrophy of thyroid (acquired): Secondary | ICD-10-CM

## 2016-12-17 DIAGNOSIS — R195 Other fecal abnormalities: Secondary | ICD-10-CM

## 2016-12-17 LAB — CBC WITH DIFFERENTIAL/PLATELET
BASOS ABS: 0 {cells}/uL (ref 0–200)
Basophils Relative: 0 %
EOS PCT: 2 %
Eosinophils Absolute: 100 cells/uL (ref 15–500)
HCT: 38.2 % (ref 35.0–45.0)
HEMOGLOBIN: 12.1 g/dL (ref 11.7–15.5)
LYMPHS ABS: 1850 {cells}/uL (ref 850–3900)
Lymphocytes Relative: 37 %
MCH: 28 pg (ref 27.0–33.0)
MCHC: 31.7 g/dL — ABNORMAL LOW (ref 32.0–36.0)
MCV: 88.4 fL (ref 80.0–100.0)
MONO ABS: 350 {cells}/uL (ref 200–950)
MPV: 8.5 fL (ref 7.5–12.5)
Monocytes Relative: 7 %
Neutro Abs: 2700 cells/uL (ref 1500–7800)
Neutrophils Relative %: 54 %
Platelets: 339 10*3/uL (ref 140–400)
RBC: 4.32 MIL/uL (ref 3.80–5.10)
RDW: 14.8 % (ref 11.0–15.0)
WBC: 5 10*3/uL (ref 3.8–10.8)

## 2016-12-18 LAB — URINALYSIS, ROUTINE W REFLEX MICROSCOPIC
Bilirubin Urine: NEGATIVE
Glucose, UA: NEGATIVE
HGB URINE DIPSTICK: NEGATIVE
Ketones, ur: NEGATIVE
LEUKOCYTES UA: NEGATIVE
NITRITE: NEGATIVE
PROTEIN: NEGATIVE
Specific Gravity, Urine: 1.008 (ref 1.001–1.035)
pH: 6 (ref 5.0–8.0)

## 2016-12-18 LAB — COMPLETE METABOLIC PANEL WITH GFR
ALT: 14 U/L (ref 6–29)
AST: 16 U/L (ref 10–30)
Albumin: 4.2 g/dL (ref 3.6–5.1)
Alkaline Phosphatase: 56 U/L (ref 33–115)
BILIRUBIN TOTAL: 0.4 mg/dL (ref 0.2–1.2)
BUN: 8 mg/dL (ref 7–25)
CHLORIDE: 103 mmol/L (ref 98–110)
CO2: 24 mmol/L (ref 20–31)
Calcium: 8.9 mg/dL (ref 8.6–10.2)
Creat: 0.65 mg/dL (ref 0.50–1.10)
Glucose, Bld: 85 mg/dL (ref 65–99)
Potassium: 4.3 mmol/L (ref 3.5–5.3)
SODIUM: 137 mmol/L (ref 135–146)
TOTAL PROTEIN: 6.5 g/dL (ref 6.1–8.1)

## 2016-12-18 LAB — LIPID PANEL
Cholesterol: 130 mg/dL (ref <200)
HDL: 50 mg/dL — AB (ref >50)
LDL CALC: 70 mg/dL (ref <100)
TRIGLYCERIDES: 52 mg/dL (ref <150)
Total CHOL/HDL Ratio: 2.6 Ratio (ref <5.0)
VLDL: 10 mg/dL (ref <30)

## 2016-12-18 LAB — T4, FREE: FREE T4: 1.5 ng/dL (ref 0.8–1.8)

## 2016-12-18 LAB — TSH: TSH: 2.6 m[IU]/L

## 2017-09-02 ENCOUNTER — Other Ambulatory Visit: Payer: Self-pay

## 2017-09-02 ENCOUNTER — Emergency Department (HOSPITAL_COMMUNITY): Payer: Managed Care, Other (non HMO)

## 2017-09-02 ENCOUNTER — Encounter (HOSPITAL_COMMUNITY): Payer: Self-pay | Admitting: Emergency Medicine

## 2017-09-02 ENCOUNTER — Observation Stay (HOSPITAL_COMMUNITY)
Admission: EM | Admit: 2017-09-02 | Discharge: 2017-09-04 | Disposition: A | Discharge status: Home / Self Care | Payer: Managed Care, Other (non HMO) | Attending: Surgery | Admitting: Surgery

## 2017-09-02 DIAGNOSIS — Z79890 Hormone replacement therapy (postmenopausal): Secondary | ICD-10-CM | POA: Insufficient documentation

## 2017-09-02 DIAGNOSIS — K37 Unspecified appendicitis: Secondary | ICD-10-CM | POA: Diagnosis present

## 2017-09-02 DIAGNOSIS — K3580 Unspecified acute appendicitis: Principal | ICD-10-CM | POA: Insufficient documentation

## 2017-09-02 DIAGNOSIS — E039 Hypothyroidism, unspecified: Secondary | ICD-10-CM | POA: Insufficient documentation

## 2017-09-02 LAB — COMPREHENSIVE METABOLIC PANEL
ALBUMIN: 4 g/dL (ref 3.5–5.0)
ALK PHOS: 68 U/L (ref 38–126)
ALT: 20 U/L (ref 14–54)
AST: 25 U/L (ref 15–41)
Anion gap: 10 (ref 5–15)
BUN: 8 mg/dL (ref 6–20)
CO2: 22 mmol/L (ref 22–32)
Calcium: 9.4 mg/dL (ref 8.9–10.3)
Chloride: 104 mmol/L (ref 101–111)
Creatinine, Ser: 0.73 mg/dL (ref 0.44–1.00)
GFR calc Af Amer: >60 mL/min (ref >60)
GFR calc non Af Amer: >60 mL/min (ref >60)
GLUCOSE: 104 mg/dL — AB (ref 65–99)
POTASSIUM: 3.6 mmol/L (ref 3.5–5.1)
SODIUM: 136 mmol/L (ref 135–145)
Total Bilirubin: 0.6 mg/dL (ref 0.3–1.2)
Total Protein: 6.9 g/dL (ref 6.5–8.1)

## 2017-09-02 LAB — CBC
HEMATOCRIT: 34.9 % — AB (ref 36.0–46.0)
Hemoglobin: 11 g/dL — ABNORMAL LOW (ref 12.0–15.0)
MCH: 26.3 pg (ref 26.0–34.0)
MCHC: 31.5 g/dL (ref 30.0–36.0)
MCV: 83.5 fL (ref 78.0–100.0)
Platelets: 321 10*3/uL (ref 150–400)
RBC: 4.18 MIL/uL (ref 3.87–5.11)
RDW: 14.4 % (ref 11.5–15.5)
WBC: 10.3 10*3/uL (ref 4.0–10.5)

## 2017-09-02 LAB — URINALYSIS, ROUTINE W REFLEX MICROSCOPIC
BACTERIA UA: NONE SEEN
BILIRUBIN URINE: NEGATIVE
Glucose, UA: NEGATIVE mg/dL
Ketones, ur: 20 mg/dL — AB
Nitrite: NEGATIVE
Protein, ur: NEGATIVE mg/dL
pH: 7 (ref 5.0–8.0)

## 2017-09-02 LAB — I-STAT BETA HCG BLOOD, ED (MC, WL, AP ONLY)

## 2017-09-02 LAB — LIPASE, BLOOD: Lipase: 26 U/L (ref 11–51)

## 2017-09-02 MED ORDER — SODIUM CHLORIDE 0.9 % IV SOLN
2.0000 g | Freq: Once | INTRAVENOUS | Status: AC
  Administered 2017-09-02: 2 g via INTRAVENOUS
  Filled 2017-09-02: qty 20

## 2017-09-02 MED ORDER — ONDANSETRON 4 MG PO TBDP
8.0000 mg | ORAL_TABLET | Freq: Once | ORAL | Status: AC
  Administered 2017-09-02: 8 mg via ORAL
  Filled 2017-09-02: qty 2

## 2017-09-02 MED ORDER — IOPAMIDOL (ISOVUE-300) INJECTION 61%
INTRAVENOUS | Status: AC
  Filled 2017-09-02: qty 100

## 2017-09-02 MED ORDER — IOPAMIDOL (ISOVUE-300) INJECTION 61%
100.0000 mL | Freq: Once | INTRAVENOUS | Status: AC | PRN
  Administered 2017-09-02: 100 mL via INTRAVENOUS

## 2017-09-02 MED ORDER — MORPHINE SULFATE (PF) 4 MG/ML IV SOLN
4.0000 mg | Freq: Once | INTRAVENOUS | Status: AC
  Administered 2017-09-02: 4 mg via INTRAVENOUS
  Filled 2017-09-02: qty 1

## 2017-09-02 MED ORDER — METRONIDAZOLE IN NACL 5-0.79 MG/ML-% IV SOLN
500.0000 mg | Freq: Once | INTRAVENOUS | Status: AC
  Administered 2017-09-02: 500 mg via INTRAVENOUS
  Filled 2017-09-02 (×2): qty 100

## 2017-09-02 MED ORDER — SODIUM CHLORIDE 0.9 % IV BOLUS
1000.0000 mL | Freq: Once | INTRAVENOUS | Status: AC
  Administered 2017-09-02: 1000 mL via INTRAVENOUS

## 2017-09-02 NOTE — ED Provider Notes (Addendum)
Charge RN notified me that patient had CT positive for appendicitis.   Clinical Course as of Sep 02 2241  Tue Sep 02, 2017  2221 Spoke with Dr. Magnus Ivan, general surgeon on call, notified him of patient CT scan positive for appendicitis.   [SJ]  2234 Dr. Magnus Ivan states he has put in admission orders for the patient. He will not be seeing the patient or performing surgery tonight. One of his partners will see the patient and perform her surgery tomorrow.    [SJ]    Clinical Course User Index [SJ] Joy, Shawn C, PA-C     Results for orders placed or performed during the hospital encounter of 09/02/17  Lipase, blood  Result Value Ref Range   Lipase 26 11 - 51 U/L  Comprehensive metabolic panel  Result Value Ref Range   Sodium 136 135 - 145 mmol/L   Potassium 3.6 3.5 - 5.1 mmol/L   Chloride 104 101 - 111 mmol/L   CO2 22 22 - 32 mmol/L   Glucose, Bld 104 (H) 65 - 99 mg/dL   BUN 8 6 - 20 mg/dL   Creatinine, Ser 1.61 0.44 - 1.00 mg/dL   Calcium 9.4 8.9 - 09.6 mg/dL   Total Protein 6.9 6.5 - 8.1 g/dL   Albumin 4.0 3.5 - 5.0 g/dL   AST 25 15 - 41 U/L   ALT 20 14 - 54 U/L   Alkaline Phosphatase 68 38 - 126 U/L   Total Bilirubin 0.6 0.3 - 1.2 mg/dL   GFR calc non Af Amer >60 >60 mL/min   GFR calc Af Amer >60 >60 mL/min   Anion gap 10 5 - 15  CBC  Result Value Ref Range   WBC 10.3 4.0 - 10.5 K/uL   RBC 4.18 3.87 - 5.11 MIL/uL   Hemoglobin 11.0 (L) 12.0 - 15.0 g/dL   HCT 04.5 (L) 40.9 - 81.1 %   MCV 83.5 78.0 - 100.0 fL   MCH 26.3 26.0 - 34.0 pg   MCHC 31.5 30.0 - 36.0 g/dL   RDW 91.4 78.2 - 95.6 %   Platelets 321 150 - 400 K/uL  I-Stat beta hCG blood, ED  Result Value Ref Range   I-stat hCG, quantitative <5.0 <5 mIU/mL   Comment 3           Ct Abdomen Pelvis W Contrast  Result Date: 09/02/2017 CLINICAL DATA:  Right lower quadrant pain beginning yesterday. Urinary frequency and burning. Nausea. EXAM: CT ABDOMEN AND PELVIS WITH CONTRAST TECHNIQUE: Multidetector CT imaging of  the abdomen and pelvis was performed using the standard protocol following bolus administration of intravenous contrast. CONTRAST:  100 ml ISOVUE-300 IOPAMIDOL (ISOVUE-300) INJECTION 61% COMPARISON:  None. FINDINGS: Lower chest: Lung bases are clear. No pleural or pericardial effusion. Hepatobiliary: No focal liver abnormality is seen. No gallstones, gallbladder wall thickening, or biliary dilatation. Pancreas: Unremarkable. No pancreatic ductal dilatation or surrounding inflammatory changes. Spleen: Normal in size without focal abnormality. Adrenals/Urinary Tract: Adrenal glands are unremarkable. Kidneys are normal, without renal calculi, focal lesion, or hydronephrosis. Bladder is unremarkable. Stomach/Bowel: Extensive inflammatory change is present about the cecum. There is and ill-defined fluid collection off the tip of the cecum. A 0.8 cm in diameter calcification is seen adjacent to the cecum at the expected location of the appendiceal orifice. The colon is otherwise unremarkable. The stomach and small bowel appear normal. Vascular/Lymphatic: No significant vascular findings are present. No enlarged abdominal or pelvic lymph nodes. Reproductive: Uterus and bilateral adnexa  are unremarkable. Other: Small volume of free pelvic fluid noted. Musculoskeletal: Negative. IMPRESSION: Findings most worrisome for acute appendicitis with phlegmon formation adjacent to the cecum and an appendicolith measuring 0.8 cm in diameter. Cecitis is possible but thought less likely. These results were called by telephone at the time of interpretation on 09/02/2017 at 9:42 pm to DR. REES, Who verbally acknowledged these results. Electronically Signed   By: Drusilla Kannerhomas  Dalessio M.D.   On: 09/02/2017 21:46   I did not see or examine this patient as she had not yet been brought back to a room when I was notified by the charge nurse of the CT positive for appendicitis. I ordered IV fluids, antibiotics, and notified the general surgeon.        Anselm PancoastJoy, Shawn C, PA-C 09/02/17 2229    Anselm PancoastJoy, Shawn C, PA-C 09/02/17 2244    Mancel BaleWentz, Elliott, MD 09/04/17 1452

## 2017-09-02 NOTE — ED Triage Notes (Signed)
Patient to ED c/o mid to RLQ abdominal pain since yesterday with nausea, also reports urinary frequency and burning sensation with urinating for the last couple of hours. Patient reports chills but denies fevers. Denies vomiting or diarrhea. Last BM today and normal.

## 2017-09-02 NOTE — ED Provider Notes (Signed)
[facility] EMERGENCY DEPARTMENT Provider Note   CSN: [id number] Arrival date & time: 09/02/17  1904     History   Chief Complaint Chief Complaint  Patient presents with  . Abdominal Pain    HPI Colleen Henderson is a 37 y.o. female.  The patient presents with abdominal pain that started yesterday in the periumbilical area and migrated to RLQ today. She has had nausea without vomiting or diarrhea. She denies vaginal discharge, abnormal vaginal bleeding. LMP x 2 weeks ago. Denies chance of pregnancy. She does report urinary frequency that is escalating. RLQ pain radiates into the right lower back. No flank pain.  The history is provided by the patient and the spouse. No language interpreter was used.  Abdominal Pain   Associated symptoms include nausea and frequency. Pertinent negatives include fever, diarrhea and vomiting.    Past Medical History:  Diagnosis Date  . Hypothyroidism 09/22/2013    Patient Active Problem List   Diagnosis Date Noted  . Appendicitis 09/02/2017  . Eczematous dermatitis 11/01/2015  . Hypothyroidism 09/22/2013  . Routine general medical examination at a health care facility 09/07/2013  . Pap smear for cervical cancer screening 09/07/2013  . Unspecified hypothyroidism 09/07/2013    Past Surgical History:  Procedure Laterality Date  . MOUTH SURGERY  04/26/2016   Tooth Implant      OB History   None      Home Medications    Prior to Admission medications   Medication Sig Start Date End Date Taking? Authorizing Provider  levothyroxine (SYNTHROID, LEVOTHROID) 75 MCG tablet Take 1 tablet (75 mcg total) by mouth daily before breakfast. 11/02/15  Yes Kirt Boys, DO  triamcinolone lotion (KENALOG) 0.1 % Apply 1 application topically 2 (two) times daily as needed. For rash Patient not taking: Reported on 09/02/2017 12/10/16   Kirt Boys, DO    Family History Family History  Problem Relation Age of Onset  . Diabetes Mother     . Diabetes Father     Social History Social History   Tobacco Use  . Smoking status: Never Smoker  . Smokeless tobacco: Never Used  Substance Use Topics  . Alcohol use: No  . Drug use: No     Allergies   Patient has no known allergies.   Review of Systems Review of Systems  Constitutional: Positive for chills. Negative for fever.  Respiratory: Negative.   Cardiovascular: Negative.   Gastrointestinal: Positive for abdominal pain and nausea. Negative for diarrhea and vomiting.  Genitourinary: Positive for frequency. Negative for flank pain, menstrual problem, vaginal bleeding and vaginal discharge.  Musculoskeletal: Positive for back pain (right lower back).  Neurological: Negative for weakness.     Physical Exam Updated Vital Signs BP 119/79 (BP Location: Right Arm)   Pulse 96   Temp 98.9 F (37.2 C) (Oral)   Resp 20   Ht 5\' 6"  (1.676 m)   Wt 60.8 kg (134 lb)   LMP 08/19/2017   SpO2 100%   BMI 21.63 kg/m   Physical Exam  Constitutional: She appears well-developed and well-nourished.  HENT:  Head: Normocephalic.  Neck: Normal range of motion. Neck supple.  Cardiovascular: Normal rate and regular rhythm.  Pulmonary/Chest: Effort normal and breath sounds normal.  Abdominal: Soft. Bowel sounds are normal. There is tenderness in the right lower quadrant and suprapubic area. There is guarding. There is no rigidity and no rebound.  Musculoskeletal: Normal range of motion.  Neurological: She is alert. No cranial nerve deficit.  Skin: Skin is warm and dry. No rash noted.  Psychiatric: She has a normal mood and affect.     ED Treatments / Results  Labs (all labs ordered are listed, but only abnormal results are displayed) Labs Reviewed  COMPREHENSIVE METABOLIC PANEL - Abnormal; Notable for the following components:      Result Value   Glucose, Bld 104 (*)    All other components within normal limits  CBC - Abnormal; Notable for the following components:    Hemoglobin 11.0 (*)    HCT 34.9 (*)    All other components within normal limits  LIPASE, BLOOD  URINALYSIS, ROUTINE W REFLEX MICROSCOPIC  I-STAT BETA HCG BLOOD, ED ([facility], [facility], [facility] ONLY)    EKG None  Radiology Ct Abdomen Pelvis W Contrast  Result Date: 09/02/2017 CLINICAL DATA:  Right lower quadrant pain beginning yesterday. Urinary frequency and burning. Nausea. EXAM: CT ABDOMEN AND PELVIS WITH CONTRAST TECHNIQUE: Multidetector CT imaging of the abdomen and pelvis was performed using the standard protocol following bolus administration of intravenous contrast. CONTRAST:  100 ml ISOVUE-300 IOPAMIDOL (ISOVUE-300) INJECTION 61% COMPARISON:  None. FINDINGS: Lower chest: Lung bases are clear. No pleural or pericardial effusion. Hepatobiliary: No focal liver abnormality is seen. No gallstones, gallbladder wall thickening, or biliary dilatation. Pancreas: Unremarkable. No pancreatic ductal dilatation or surrounding inflammatory changes. Spleen: Normal in size without focal abnormality. Adrenals/Urinary Tract: Adrenal glands are unremarkable. Kidneys are normal, without renal calculi, focal lesion, or hydronephrosis. Bladder is unremarkable. Stomach/Bowel: Extensive inflammatory change is present about the cecum. There is and ill-defined fluid collection off the tip of the cecum. A 0.8 cm in diameter calcification is seen adjacent to the cecum at the expected location of the appendiceal orifice. The colon is otherwise unremarkable. The stomach and small bowel appear normal. Vascular/Lymphatic: No significant vascular findings are present. No enlarged abdominal or pelvic lymph nodes. Reproductive: Uterus and bilateral adnexa are unremarkable. Other: Small volume of free pelvic fluid noted. Musculoskeletal: Negative. IMPRESSION: Findings most worrisome for acute appendicitis with phlegmon formation adjacent to the cecum and an appendicolith measuring 0.8 cm in diameter. Cecitis is possible but thought less likely.  These results were called by telephone at the time of interpretation on 09/02/2017 at 9:42 pm to DR. REES, Who verbally acknowledged these results. Electronically Signed   By: Drusilla Kanner M.D.   On: 09/02/2017 21:46    Procedures Procedures (including critical care time)  Medications Ordered in ED Medications  iopamidol (ISOVUE-300) 61 % injection (has no administration in time range)  sodium chloride 0.9 % bolus 1,000 mL (has no administration in time range)  cefTRIAXone (ROCEPHIN) 2 g in sodium chloride 0.9 % 100 mL IVPB (has no administration in time range)    And  metroNIDAZOLE (FLAGYL) IVPB 500 mg (has no administration in time range)  ondansetron (ZOFRAN-ODT) disintegrating tablet 8 mg (8 mg Oral Given 09/02/17 2024)  iopamidol (ISOVUE-300) 61 % injection 100 mL (100 mLs Intravenous Contrast Given 09/02/17 2112)     Initial Impression / Assessment and Plan / ED Course  I have reviewed the triage vital signs and the nursing notes.  Pertinent labs & imaging results that were available during my care of the patient were reviewed by me and considered in my medical decision making (see chart for details).  Clinical Course as of Sep 03 2246  Tue Sep 02, 2017  2221 Spoke with Dr. Magnus Ivan, general surgeon on call, notified him of patient CT scan positive for appendicitis.   [  SJ]  2234 Dr. Magnus IvanBlackman states he has put in admission orders for the patient. He will not be seeing the patient or performing surgery tonight. One of his partners will see the patient and perform her surgery tomorrow.    [SJ]    Clinical Course User Index [SJ] Joy, Shawn C, PA-C    Patient resents with abdominal pain periumbilical --> RLQ, starting yesterday. No known fever - reports chills.  CT scan performed in triage shows acute appendicitis. These results were discussed with Dr. Magnus IvanBlackman (gen. Surgery) who accepts the patient onto his service with plan to do surgery in the morning.   Pain addressed - 4 mg  Morphine. NPO starting midnight.   Results and plan of care discussed with patient and husband. All questions answered.   UA pending to evaluate for c/o urinary frequency. UA shows no infection.   Final Clinical Impressions(s) / ED Diagnoses   Final diagnoses:  None   1. Acute appendicitis  ED Discharge Orders    None       Elpidio AnisUpstill, Lindsay Soulliere, PA-C 09/03/17 0111    Little, Ambrose Finlandachel Morgan, MD 09/03/17 1401

## 2017-09-02 NOTE — ED Provider Notes (Signed)
Patient placed in Quick Look pathway, seen and evaluated   Chief Complaint: abdominal pain  HPI:   Pain over umbilicus and right lower abdomen and flank. Associated n/v, anorexia. Some dysuria. Normal bowel movement today.   ROS: positive for abd pain, nausea, vomiting, dysuria, flank pain, anorexia  Physical Exam:   Gen: No distress  Neuro: Awake and Alert  Skin: Warm    Focused Exam: Patient appears to be uncomfortable.  Tender to palpation of right upper quadrant and right lower quadrant.  Guarding present.  No rebound tenderness present.  Also right CVA tenderness.  Patient with concerning symptoms for possible pyelonephritis versus appendicitis.  She does have some dysuria and right CVA tenderness, will check urine.  She also reports most severe pain around her umbilicus and right lower quadrant, which is suspicious for possible appendicitis.  I will get CT abdomen pelvis for further evaluation.     Initiation of care has begun. The patient has been counseled on the process, plan, and necessity for staying for the completion/evaluation, and the remainder of the medical screening examination    Jaynie CrumbleKirichenko, Pegah Segel, Cordelia Poche-C 09/02/17 2015    Tilden Fossaees, Elizabeth, MD 09/03/17 [phone]

## 2017-09-03 ENCOUNTER — Encounter (HOSPITAL_COMMUNITY): Payer: Self-pay | Admitting: *Deleted

## 2017-09-03 ENCOUNTER — Observation Stay (HOSPITAL_COMMUNITY): Payer: Managed Care, Other (non HMO) | Admitting: Certified Registered"

## 2017-09-03 ENCOUNTER — Encounter (HOSPITAL_COMMUNITY)
Admission: EM | Disposition: A | Discharge status: Home / Self Care | Payer: Self-pay | Source: Home / Self Care | Attending: Emergency Medicine

## 2017-09-03 HISTORY — PX: LAPAROSCOPIC APPENDECTOMY: SHX408

## 2017-09-03 HISTORY — PX: APPENDECTOMY: SHX54

## 2017-09-03 LAB — HIV ANTIBODY (ROUTINE TESTING W REFLEX): HIV Screen 4th Generation wRfx: NONREACTIVE

## 2017-09-03 LAB — SURGICAL PCR SCREEN
MRSA, PCR: NEGATIVE
STAPHYLOCOCCUS AUREUS: NEGATIVE

## 2017-09-03 LAB — PREGNANCY, URINE: PREG TEST UR: NEGATIVE

## 2017-09-03 SURGERY — APPENDECTOMY, LAPAROSCOPIC
Anesthesia: General | Site: Abdomen

## 2017-09-03 MED ORDER — ROCURONIUM BROMIDE 100 MG/10ML IV SOLN
INTRAVENOUS | Status: DC | PRN
  Administered 2017-09-03: 50 mg via INTRAVENOUS

## 2017-09-03 MED ORDER — PROPOFOL 10 MG/ML IV BOLUS
INTRAVENOUS | Status: DC | PRN
  Administered 2017-09-03: 110 mg via INTRAVENOUS

## 2017-09-03 MED ORDER — ONDANSETRON HCL 4 MG/2ML IJ SOLN
INTRAMUSCULAR | Status: DC | PRN
  Administered 2017-09-03: 4 mg via INTRAVENOUS

## 2017-09-03 MED ORDER — BUPIVACAINE-EPINEPHRINE (PF) 0.25% -1:200000 IJ SOLN
INTRAMUSCULAR | Status: AC
  Filled 2017-09-03: qty 30

## 2017-09-03 MED ORDER — FENTANYL CITRATE (PF) 100 MCG/2ML IJ SOLN
INTRAMUSCULAR | Status: DC | PRN
  Administered 2017-09-03: 50 ug via INTRAVENOUS
  Administered 2017-09-03: 100 ug via INTRAVENOUS
  Administered 2017-09-03 (×2): 50 ug via INTRAVENOUS

## 2017-09-03 MED ORDER — MIDAZOLAM HCL 2 MG/2ML IJ SOLN
INTRAMUSCULAR | Status: AC
  Filled 2017-09-03: qty 2

## 2017-09-03 MED ORDER — ONDANSETRON 4 MG PO TBDP
4.0000 mg | ORAL_TABLET | Freq: Four times a day (QID) | ORAL | Status: DC | PRN
  Filled 2017-09-03: qty 1

## 2017-09-03 MED ORDER — LIDOCAINE HCL (CARDIAC) 20 MG/ML IV SOLN
INTRAVENOUS | Status: DC | PRN
  Administered 2017-09-03: 100 mg via INTRAVENOUS

## 2017-09-03 MED ORDER — BUPIVACAINE HCL 0.25 % IJ SOLN
INTRAMUSCULAR | Status: DC | PRN
  Administered 2017-09-03: 6 mL

## 2017-09-03 MED ORDER — POTASSIUM CHLORIDE IN NACL 20-0.9 MEQ/L-% IV SOLN
INTRAVENOUS | Status: DC
  Filled 2017-09-03: qty 1000

## 2017-09-03 MED ORDER — MIDAZOLAM HCL 5 MG/5ML IJ SOLN
INTRAMUSCULAR | Status: DC | PRN
  Administered 2017-09-03: 2 mg via INTRAVENOUS

## 2017-09-03 MED ORDER — ONDANSETRON HCL 4 MG/2ML IJ SOLN: 4.0000 mg | Freq: Four times a day (QID) | INTRAMUSCULAR | Status: DC | PRN

## 2017-09-03 MED ORDER — LACTATED RINGERS IV SOLN
INTRAVENOUS | Status: DC
  Administered 2017-09-03 (×2): via INTRAVENOUS

## 2017-09-03 MED ORDER — HYDROMORPHONE HCL 1 MG/ML IJ SOLN
0.2500 mg | INTRAMUSCULAR | Status: DC | PRN
  Administered 2017-09-03: 0.5 mg via INTRAVENOUS

## 2017-09-03 MED ORDER — PROPOFOL 10 MG/ML IV BOLUS
INTRAVENOUS | Status: AC
  Filled 2017-09-03: qty 20

## 2017-09-03 MED ORDER — SUCCINYLCHOLINE CHLORIDE 20 MG/ML IJ SOLN
INTRAMUSCULAR | Status: DC | PRN
  Administered 2017-09-03: 120 mg via INTRAVENOUS

## 2017-09-03 MED ORDER — DEXAMETHASONE SODIUM PHOSPHATE 10 MG/ML IJ SOLN
INTRAMUSCULAR | Status: DC | PRN
  Administered 2017-09-03: 10 mg via INTRAVENOUS

## 2017-09-03 MED ORDER — ENOXAPARIN SODIUM 40 MG/0.4ML ~~LOC~~ SOLN: 40.0000 mg | SUBCUTANEOUS | Status: DC

## 2017-09-03 MED ORDER — ENOXAPARIN SODIUM 40 MG/0.4ML ~~LOC~~ SOLN
40.0000 mg | SUBCUTANEOUS | Status: DC
  Administered 2017-09-04: 40 mg via SUBCUTANEOUS
  Filled 2017-09-03: qty 0.4

## 2017-09-03 MED ORDER — ALUM & MAG HYDROXIDE-SIMETH 200-200-20 MG/5ML PO SUSP
30.0000 mL | ORAL | Status: DC | PRN
  Administered 2017-09-03: 30 mL via ORAL
  Filled 2017-09-03: qty 30

## 2017-09-03 MED ORDER — LIDOCAINE HCL (CARDIAC) 20 MG/ML IV SOLN: INTRAVENOUS | Status: DC | PRN

## 2017-09-03 MED ORDER — FENTANYL CITRATE (PF) 250 MCG/5ML IJ SOLN
INTRAMUSCULAR | Status: AC
  Filled 2017-09-03: qty 5

## 2017-09-03 MED ORDER — SUGAMMADEX SODIUM 200 MG/2ML IV SOLN
INTRAVENOUS | Status: DC | PRN
  Administered 2017-09-03: 120 mg via INTRAVENOUS

## 2017-09-03 MED ORDER — IBUPROFEN 400 MG PO TABS
400.0000 mg | ORAL_TABLET | Freq: Four times a day (QID) | ORAL | Status: DC
  Administered 2017-09-04: 400 mg via ORAL
  Filled 2017-09-03: qty 1
  Filled 2017-09-03: qty 2
  Filled 2017-09-03 (×2): qty 1
  Filled 2017-09-03: qty 2
  Filled 2017-09-03 (×2): qty 1

## 2017-09-03 MED ORDER — OXYCODONE HCL 5 MG PO TABS
5.0000 mg | ORAL_TABLET | ORAL | Status: DC | PRN
  Filled 2017-09-03: qty 1

## 2017-09-03 MED ORDER — 0.9 % SODIUM CHLORIDE (POUR BTL) OPTIME
TOPICAL | Status: DC | PRN
  Administered 2017-09-03: 1000 mL

## 2017-09-03 MED ORDER — SODIUM CHLORIDE 0.9 % IR SOLN
Status: DC | PRN
  Administered 2017-09-03: 1

## 2017-09-03 MED ORDER — ACETAMINOPHEN 325 MG PO TABS
650.0000 mg | ORAL_TABLET | Freq: Four times a day (QID) | ORAL | Status: DC
  Administered 2017-09-03 – 2017-09-04 (×2): 650 mg via ORAL
  Filled 2017-09-03 (×2): qty 2

## 2017-09-03 MED ORDER — PROPOFOL 10 MG/ML IV BOLUS: INTRAVENOUS | Status: DC | PRN

## 2017-09-03 MED ORDER — HYDROMORPHONE HCL 1 MG/ML IJ SOLN
INTRAMUSCULAR | Status: AC
  Filled 2017-09-03: qty 1

## 2017-09-03 MED ORDER — METRONIDAZOLE IN NACL 5-0.79 MG/ML-% IV SOLN
INTRAVENOUS | Status: DC | PRN
  Administered 2017-09-03: 500 mg via INTRAVENOUS

## 2017-09-03 MED ORDER — MORPHINE SULFATE (PF) 2 MG/ML IV SOLN
1.0000 mg | INTRAVENOUS | Status: DC | PRN
  Administered 2017-09-03 (×2): 2 mg via INTRAVENOUS
  Administered 2017-09-03: 3 mg via INTRAVENOUS
  Administered 2017-09-03: 2 mg via INTRAVENOUS
  Filled 2017-09-03: qty 1
  Filled 2017-09-03: qty 2
  Filled 2017-09-03 (×2): qty 1

## 2017-09-03 SURGICAL SUPPLY — 43 items
APPLIER CLIP 5 13 M/L LIGAMAX5 (MISCELLANEOUS)
BENZOIN TINCTURE PRP APPL 2/3 (GAUZE/BANDAGES/DRESSINGS) ×3 IMPLANT
BLADE CLIPPER SURG (BLADE) IMPLANT
CANISTER SUCT 3000ML PPV (MISCELLANEOUS) ×3 IMPLANT
CHLORAPREP W/TINT 26ML (MISCELLANEOUS) ×3 IMPLANT
CLIP APPLIE 5 13 M/L LIGAMAX5 (MISCELLANEOUS) IMPLANT
CLOSURE WOUND 1/2 X4 (GAUZE/BANDAGES/DRESSINGS) ×1
CONT SPEC 4OZ CLIKSEAL STRL BL (MISCELLANEOUS) ×3 IMPLANT
COVER SURGICAL LIGHT HANDLE (MISCELLANEOUS) ×3 IMPLANT
COVER TRANSDUCER ULTRASND (DRAPES) ×3 IMPLANT
DERMABOND ADVANCED (GAUZE/BANDAGES/DRESSINGS) ×2
DERMABOND ADVANCED .7 DNX12 (GAUZE/BANDAGES/DRESSINGS) ×1 IMPLANT
ELECT REM PT RETURN 9FT ADLT (ELECTROSURGICAL) ×3
ELECTRODE REM PT RTRN 9FT ADLT (ELECTROSURGICAL) ×1 IMPLANT
ENDOLOOP SUT PDS II  0 18 (SUTURE) ×6
ENDOLOOP SUT PDS II 0 18 (SUTURE) ×3 IMPLANT
GAUZE SPONGE 2X2 8PLY STRL LF (GAUZE/BANDAGES/DRESSINGS) ×1 IMPLANT
GLOVE BIO SURGEON STRL SZ7.5 (GLOVE) ×3 IMPLANT
GOWN STRL REUS W/ TWL LRG LVL3 (GOWN DISPOSABLE) ×2 IMPLANT
GOWN STRL REUS W/ TWL XL LVL3 (GOWN DISPOSABLE) ×1 IMPLANT
GOWN STRL REUS W/TWL LRG LVL3 (GOWN DISPOSABLE) ×4
GOWN STRL REUS W/TWL XL LVL3 (GOWN DISPOSABLE) ×2
GRASPER SUT TROCAR 14GX15 (MISCELLANEOUS) ×3 IMPLANT
KIT BASIN OR (CUSTOM PROCEDURE TRAY) ×3 IMPLANT
KIT TURNOVER KIT B (KITS) ×3 IMPLANT
NEEDLE INSUFFLATION 14GA 120MM (NEEDLE) ×3 IMPLANT
NS IRRIG 1000ML POUR BTL (IV SOLUTION) ×3 IMPLANT
PAD ARMBOARD 7.5X6 YLW CONV (MISCELLANEOUS) ×6 IMPLANT
SCISSORS LAP 5X35 DISP (ENDOMECHANICALS) ×3 IMPLANT
SET IRRIG TUBING LAPAROSCOPIC (IRRIGATION / IRRIGATOR) ×3 IMPLANT
SLEEVE ENDOPATH XCEL 5M (ENDOMECHANICALS) ×3 IMPLANT
SPECIMEN JAR SMALL (MISCELLANEOUS) ×3 IMPLANT
SPONGE GAUZE 2X2 STER 10/PKG (GAUZE/BANDAGES/DRESSINGS) ×2
STRIP CLOSURE SKIN 1/2X4 (GAUZE/BANDAGES/DRESSINGS) ×2 IMPLANT
SUT MNCRL AB 4-0 PS2 18 (SUTURE) ×3 IMPLANT
TOWEL OR 17X24 6PK STRL BLUE (TOWEL DISPOSABLE) ×3 IMPLANT
TOWEL OR 17X26 10 PK STRL BLUE (TOWEL DISPOSABLE) ×3 IMPLANT
TRAY FOLEY CATH SILVER 16FR (SET/KITS/TRAYS/PACK) ×3 IMPLANT
TRAY LAPAROSCOPIC MC (CUSTOM PROCEDURE TRAY) ×3 IMPLANT
TROCAR XCEL NON-BLD 11X100MML (ENDOMECHANICALS) ×3 IMPLANT
TROCAR XCEL NON-BLD 5MMX100MML (ENDOMECHANICALS) ×3 IMPLANT
TUBING INSUFFLATION (TUBING) ×3 IMPLANT
WATER STERILE IRR 1000ML POUR (IV SOLUTION) ×3 IMPLANT

## 2017-09-03 NOTE — Progress Notes (Signed)
Pt states needs a vegetarian diet.

## 2017-09-03 NOTE — Transfer of Care (Signed)
Immediate Anesthesia Transfer of Care Note  Patient: Colleen Henderson  Procedure(s) Performed: APPENDECTOMY LAPAROSCOPIC (N/A Abdomen)  Patient Location: PACU  Anesthesia Type:General  Level of Consciousness: responds to stimulation  Airway & Oxygen Therapy: Patient Spontanous Breathing and Patient connected to nasal cannula oxygen  Post-op Assessment: Report given to RN and Post -op Vital signs reviewed and stable  Post vital signs: Reviewed and stable  Last Vitals:  Vitals Value Taken Time  BP 94/53 09/03/2017  1:27 PM  Temp    Pulse 72 09/03/2017  1:28 PM  Resp 14 09/03/2017  1:28 PM  SpO2 95 % 09/03/2017  1:28 PM  Vitals shown include unvalidated device data.  Last Pain:  Vitals:   09/03/17 1325  TempSrc:   PainSc: Asleep         Complications: No apparent anesthesia complications

## 2017-09-03 NOTE — Anesthesia Procedure Notes (Signed)
Procedure Name: Intubation Date/Time: 09/03/2017 11:44 AM Performed by: Babs Bertin, CRNA Pre-anesthesia Checklist: Patient identified, Emergency Drugs available, Suction available and Patient being monitored Patient Re-evaluated:Patient Re-evaluated prior to induction Oxygen Delivery Method: Circle System Utilized Preoxygenation: Pre-oxygenation with 100% oxygen Induction Type: IV induction and Rapid sequence Laryngoscope Size: Mac and 3 Grade View: Grade I Tube type: Oral Tube size: 7.0 mm Number of attempts: 1 Airway Equipment and Method: Stylet and Oral airway Placement Confirmation: ETT inserted through vocal cords under direct vision,  positive ETCO2 and breath sounds checked- equal and bilateral Secured at: 21 cm Tube secured with: Tape Dental Injury: Teeth and Oropharynx as per pre-operative assessment

## 2017-09-03 NOTE — Anesthesia Preprocedure Evaluation (Addendum)
Anesthesia Evaluation  Patient identified by MRN, date of birth, ID band Patient awake    Reviewed: Allergy & Precautions, H&P , Patient's Chart, lab work & pertinent test results, reviewed documented beta blocker date and time   Airway Mallampati: II  TM Distance: >3 FB Neck ROM: full  Mouth opening: Limited Mouth Opening  Dental no notable dental hx. (+) Implants,    Pulmonary    Pulmonary exam normal breath sounds clear to auscultation       Cardiovascular  Rhythm:regular Rate:Normal     Neuro/Psych    GI/Hepatic   Endo/Other    Renal/GU      Musculoskeletal   Abdominal   Peds  Hematology   Anesthesia Other Findings   Reproductive/Obstetrics                            Anesthesia Physical Anesthesia Plan  ASA: II and emergent  Anesthesia Plan: General   Post-op Pain Management:    Induction: Intravenous  PONV Risk Score and Plan: Treatment may vary due to age or medical condition, Dexamethasone and Ondansetron  Airway Management Planned: Oral ETT  Additional Equipment:   Intra-op Plan:   Post-operative Plan: Extubation in OR  Informed Consent: I have reviewed the patients History and Physical, chart, labs and discussed the procedure including the risks, benefits and alternatives for the proposed anesthesia with the patient or authorized representative who has indicated his/her understanding and acceptance.   Dental Advisory Given  Plan Discussed with: CRNA and Surgeon  Anesthesia Plan Comments: (  )        Anesthesia Quick Evaluation

## 2017-09-03 NOTE — H&P (Signed)
Colleen Henderson is an 37 y.o. female.   Chief Complaint: RLQ abdominal pain HPI: This is a pleasant 37 year old female who presented last evening with right lower quadrant abdominal pain.  She described mild pain that started around the umbilicus and then became more sharp moving to the right lower quadrant.  The pain is now moderate to severe and worse with motion.  She has nausea but no vomiting.  She denies fevers.  She is otherwise without complaints.  Bowel movements have been normal.  Past Medical History:  Diagnosis Date  . Hypothyroidism 09/22/2013    Past Surgical History:  Procedure Laterality Date  . MOUTH SURGERY  04/26/2016   Tooth Implant     Family History  Problem Relation Age of Onset  . Diabetes Mother   . Diabetes Father    Social History:  reports that she has never smoked. She has never used smokeless tobacco. She reports that she does not drink alcohol or use drugs.  Allergies: No Known Allergies  Medications Prior to Admission  Medication Sig Dispense Refill  . levothyroxine (SYNTHROID, LEVOTHROID) 75 MCG tablet Take 1 tablet (75 mcg total) by mouth daily before breakfast. 30 tablet 0  . triamcinolone lotion (KENALOG) 0.1 % Apply 1 application topically 2 (two) times daily as needed. For rash (Patient not taking: Reported on 09/02/2017) 60 mL 1    Results for orders placed or performed during the hospital encounter of 09/02/17 (from the past 48 hour(s))  Lipase, blood     Status: None   Collection Time: 09/02/17  8:22 PM  Result Value Ref Range   Lipase 26 11 - 51 U/L    Comment: Performed at [facility], [place] [address], [place], [place] [address]  Comprehensive metabolic panel     Status: Abnormal   Collection Time: 09/02/17  8:22 PM  Result Value Ref Range   Sodium 136 135 - 145 mmol/L   Potassium 3.6 3.5 - 5.1 mmol/L   Chloride 104 101 - 111 mmol/L   CO2 22 22 - 32 mmol/L   Glucose, Bld 104 (H) 65 - 99 mg/dL   BUN 8 6 - 20 mg/dL   Creatinine,  Ser 0.73 0.44 - 1.00 mg/dL   Calcium 9.4 8.9 - 10.3 mg/dL   Total Protein 6.9 6.5 - 8.1 g/dL   Albumin 4.0 3.5 - 5.0 g/dL   AST 25 15 - 41 U/L   ALT 20 14 - 54 U/L   Alkaline Phosphatase 68 38 - 126 U/L   Total Bilirubin 0.6 0.3 - 1.2 mg/dL   GFR calc non Af Amer >60 >60 mL/min   GFR calc Af Amer >60 >60 mL/min    Comment: (NOTE) The eGFR has been calculated using the CKD EPI equation. This calculation has not been validated in all clinical situations. eGFR's persistently <60 mL/min signify possible Chronic Kidney Disease.    Anion gap 10 5 - 15    Comment: Performed at [facility] [address], [place], [place] [address]  CBC     Status: Abnormal   Collection Time: 09/02/17  8:22 PM  Result Value Ref Range   WBC 10.3 4.0 - 10.5 K/uL   RBC 4.18 3.87 - 5.11 MIL/uL   Hemoglobin 11.0 (L) 12.0 - 15.0 g/dL   HCT 34.9 (L) 36.0 - 46.0 %   MCV 83.5 78.0 - 100.0 fL   MCH 26.3 26.0 - 34.0 pg   MCHC 31.5 30.0 - 36.0 g/dL  RDW 14.4 11.5 - 15.5 %   Platelets 321 150 - 400 K/uL    Comment: Performed at [facility], [place] [address], [place], [place] [address]  I-Stat beta hCG blood, ED     Status: None   Collection Time: 09/02/17  8:32 PM  Result Value Ref Range   I-stat hCG, quantitative <5.0 <5 mIU/mL   Comment 3            Comment:   GEST. AGE      CONC.  (mIU/mL)   <=1 WEEK        5 - 50     2 WEEKS       50 - 500     3 WEEKS       100 - 10,000     4 WEEKS     1,000 - 30,000        FEMALE AND NON-PREGNANT FEMALE:     LESS THAN 5 mIU/mL   Urinalysis, Routine w reflex microscopic     Status: Abnormal   Collection Time: 09/02/17 11:30 PM  Result Value Ref Range   Color, Urine STRAW (A) YELLOW   APPearance CLEAR CLEAR   Specific Gravity, Urine >1.046 (H) 1.005 - 1.030   pH 7.0 5.0 - 8.0   Glucose, UA NEGATIVE NEGATIVE mg/dL   Hgb urine dipstick SMALL (A) NEGATIVE   Bilirubin Urine NEGATIVE NEGATIVE   Ketones, ur 20 (A) NEGATIVE mg/dL   Protein, ur NEGATIVE  NEGATIVE mg/dL   Nitrite NEGATIVE NEGATIVE   Leukocytes, UA TRACE (A) NEGATIVE   RBC / HPF 0-5 0 - 5 RBC/hpf   WBC, UA 0-5 0 - 5 WBC/hpf   Bacteria, UA NONE SEEN NONE SEEN   Squamous Epithelial / LPF 0-5 (A) NONE SEEN    Comment: Performed at [facility], [place] [address], [place], [place] [address]  Surgical pcr screen     Status: None   Collection Time: 09/03/17  1:01 AM  Result Value Ref Range   MRSA, PCR NEGATIVE NEGATIVE   Staphylococcus aureus NEGATIVE NEGATIVE    Comment: (NOTE) The Xpert SA Assay (FDA approved for NASAL specimens in patients 31 years of age and older), is one component of a comprehensive surveillance program. It is not intended to diagnose infection nor to guide or monitor treatment. Performed at [facility], [place] [address] [place], [place] [address]    Ct Abdomen Pelvis W Contrast  Result Date: 09/02/2017 CLINICAL DATA:  Right lower quadrant pain beginning yesterday. Urinary frequency and burning. Nausea. EXAM: CT ABDOMEN AND PELVIS WITH CONTRAST TECHNIQUE: Multidetector CT imaging of the abdomen and pelvis was performed using the standard protocol following bolus administration of intravenous contrast. CONTRAST:  100 ml ISOVUE-300 IOPAMIDOL (ISOVUE-300) INJECTION 61% COMPARISON:  None. FINDINGS: Lower chest: Lung bases are clear. No pleural or pericardial effusion. Hepatobiliary: No focal liver abnormality is seen. No gallstones, gallbladder wall thickening, or biliary dilatation. Pancreas: Unremarkable. No pancreatic ductal dilatation or surrounding inflammatory changes. Spleen: Normal in size without focal abnormality. Adrenals/Urinary Tract: Adrenal glands are unremarkable. Kidneys are normal, without renal calculi, focal lesion, or hydronephrosis. Bladder is unremarkable. Stomach/Bowel: Extensive inflammatory change is present about the cecum. There is and ill-defined fluid collection off the tip of the cecum. A 0.8 cm in diameter calcification is  seen adjacent to the cecum at the expected location of the appendiceal orifice. The colon is otherwise unremarkable. The stomach and small bowel appear normal. Vascular/Lymphatic:  No significant vascular findings are present. No enlarged abdominal or pelvic lymph nodes. Reproductive: Uterus and bilateral adnexa are unremarkable. Other: Small volume of free pelvic fluid noted. Musculoskeletal: Negative. IMPRESSION: Findings most worrisome for acute appendicitis with phlegmon formation adjacent to the cecum and an appendicolith measuring 0.8 cm in diameter. Cecitis is possible but thought less likely. These results were called by telephone at the time of interpretation on 09/02/2017 at 9:42 pm to DR. REES, Who verbally acknowledged these results. Electronically Signed   By: Inge Rise M.D.   On: 09/02/2017 21:46    Review of Systems  Constitutional: Negative for chills and fever.  Respiratory: Negative for cough and shortness of breath.   Cardiovascular: Negative for chest pain.  Gastrointestinal: Positive for abdominal pain and nausea. Negative for constipation, diarrhea and vomiting.  Genitourinary: Positive for dysuria.  Musculoskeletal: Negative for neck pain.  Neurological: Negative for headaches.  All other systems reviewed and are negative.   Blood pressure 97/63, pulse 91, temperature 99.5 F (37.5 C), temperature source Oral, resp. rate 16, height _0  (1.676 m), weight 60.8 kg (134 lb), last menstrual period 08/19/2017, SpO2 100 %. Physical Exam  Constitutional: She is oriented to person, place, and time. She appears well-developed and well-nourished. No distress.  HENT:  Head: Normocephalic and atraumatic.  Right Ear: External ear normal.  Left Ear: External ear normal.  Nose: Nose normal.  Mouth/Throat: Oropharynx is clear and moist. No oropharyngeal exudate.  Eyes: Pupils are equal, round, and reactive to light. EOM are normal. Right eye exhibits no discharge. Left eye  exhibits no discharge. No scleral icterus.  Neck: Normal range of motion. Neck supple. No tracheal deviation present.  Cardiovascular: Normal rate, regular rhythm, normal heart sounds and intact distal pulses.  No murmur heard. Respiratory: Effort normal and breath sounds normal. No respiratory distress. She has no wheezes.  GI: Soft. She exhibits no distension. There is tenderness. There is rebound and guarding.  There is tenderness with guarding in the right lower quadrant  Musculoskeletal: Normal range of motion. She exhibits no edema or deformity.  Neurological: She is alert and oriented to person, place, and time.  Skin: Skin is warm and dry. No erythema.  Psychiatric: Her behavior is normal. Judgment normal.     Assessment/Plan Acute appendicitis  I discussed the diagnosis with the patient and her family.  She does have an appendicolith and a significant phlegmon.  Laparoscopic appendectomy is recommended.  I discussed this with them in detail.  I will discussed this with Dr. Rosendo Gros who is her main surgeon today and who will perform the surgery at his discretion.  Harl Bowie, MD 09/03/2017, 6:07 AM

## 2017-09-03 NOTE — Anesthesia Postprocedure Evaluation (Signed)
Anesthesia Post Note  Patient: Colleen Henderson  Procedure(s) Performed: APPENDECTOMY LAPAROSCOPIC (N/A Abdomen)     Patient location during evaluation: PACU Anesthesia Type: General Level of consciousness: awake and alert Pain management: pain level controlled Vital Signs Assessment: post-procedure vital signs reviewed and stable Respiratory status: spontaneous breathing, nonlabored ventilation, respiratory function stable and patient connected to nasal cannula oxygen Cardiovascular status: blood pressure returned to baseline and stable Postop Assessment: no apparent nausea or vomiting Anesthetic complications: no    Last Vitals:  Vitals:   09/03/17 1418 09/03/17 1626  BP: 98/65 111/65  Pulse: 76 69  Resp:    Temp:    SpO2: 96%     Last Pain:  Vitals:   09/03/17 1824  TempSrc:   PainSc: 6                  Eldin Bonsell EDWARD

## 2017-09-03 NOTE — Op Note (Signed)
09/03/2017  12:22 PM  PATIENT:  Colleen Henderson  37 y.o. female  PRE-OPERATIVE DIAGNOSIS:  appendicitis  POST-OPERATIVE DIAGNOSIS: acute non perforated appendicitis  PROCEDURE:  Procedure(s): APPENDECTOMY LAPAROSCOPIC (N/A)  SURGEON:  Surgeon(s) and Role:    * Axel Filleramirez, Mamye Bolds, MD - Primary   ANESTHESIA:   local and general  EBL:  5 mL   BLOOD ADMINISTERED:none  DRAINS: none   LOCAL MEDICATIONS USED:  BUPIVICAINE   SPECIMEN:  Source of Specimen:  appendix  DISPOSITION OF SPECIMEN:  PATHOLOGY  COUNTS:  YES  TOURNIQUET:  * No tourniquets in log *  DICTATION: .Dragon Dictation  Complications: none  Counts: reported as correct x 2  Findings:  The patient had a acutely inflamed non perforated appendix  Specimen: Appendix  Indications for procedure:  The patient is a 37 year old female with a history of periumbilical pain localized in the right lower quadrant patient had a CT scan which revealed signs consistent with acute appendicitis the patient back in for laparoscopic appendectomy.  Details of the procedure:The patient was taken back to the operating room. The patient was placed in supine position with bilateral SCDs in place.   The patient was prepped and draped in the usual sterile fashion.  After appropriate anitbiotics were confirmed, a time-out was confirmed and all facts were verified.    A pneumoperitoneum of 14 mmHg was obtained via a Veress needle technique in the left lower quadrant quadrant.  A 5 mm trocar and 5 mm camera then placed intra-abdominally there is no injury to any intra-abdominal organs a 10 mm infraumbilical port was placed and direct visualization as was a 5 mm port in the suprapubic area.   The appendix was identified and seen to be non-perforated.  The appendix was cleaned down to the appendiceal base. The mesoappendix was then incised and the appendiceal artery was cauterized.  The the appendiceal base was clean.  At this time an Endoloop was  placed proximallyx2 and one distally and the appendix was transected between these 2. A retrieval bag was then placed into the abdomen and the specimen placed in the bag. The appendiceal stump was cauterized. We evacuate the fluid from the pelvis until the effluent was clear.  The appendix and retrieval  bag was then retrieved via the supraumbilical port. #1 Vicryl was used to reapproximate the fascia at the umbilical port site x1. The skin was reapproximated all port sites 3-0 Monocryl subcuticular fashion. The skin was dressed with steri-strips, guaze, and tape.  The patient had the foley removed. The patient was awakened from general anesthesia was taken to recovery room in stable condition.      PLAN OF CARE: Admit for overnight observation  PATIENT DISPOSITION:  PACU - hemodynamically stable.   Delay start of Pharmacological VTE agent (>24hrs) due to surgical blood loss or risk of bleeding: not applicable

## 2017-09-04 ENCOUNTER — Encounter (HOSPITAL_COMMUNITY): Payer: Self-pay | Admitting: General Surgery

## 2017-09-04 ENCOUNTER — Other Ambulatory Visit: Payer: Self-pay

## 2017-09-04 MED ORDER — IBUPROFEN 400 MG PO TABS: 400.0000 mg | ORAL_TABLET | Freq: Four times a day (QID) | ORAL | 0 refills | Status: DC

## 2017-09-04 MED ORDER — ACETAMINOPHEN 325 MG PO TABS: 650.0000 mg | ORAL_TABLET | Freq: Four times a day (QID) | ORAL | Status: DC

## 2017-09-04 MED ORDER — TRAMADOL HCL 50 MG PO TABS: 50.0000 mg | ORAL_TABLET | Freq: Four times a day (QID) | ORAL | 0 refills | Status: DC | PRN

## 2017-09-04 MED ORDER — TRAMADOL HCL 50 MG PO TABS
50.0000 mg | ORAL_TABLET | Freq: Four times a day (QID) | ORAL | Status: DC | PRN
  Administered 2017-09-04: 50 mg via ORAL
  Filled 2017-09-04: qty 1

## 2017-09-04 NOTE — Discharge Summary (Signed)
[facility] Discharge Summary   Patient ID: Colleen Henderson MRN: [id number] DOB/AGE: 1981/05/11 37 y.o.  Admit date: 09/02/2017 Discharge date: 09/04/2017  Admitting Diagnosis: Acute appendicitis  Discharge Diagnosis S/p laparoscopic appendectomy   Consultants None   Imaging: Ct Abdomen Pelvis W Contrast  Result Date: 09/02/2017 CLINICAL DATA:  Right lower quadrant pain beginning yesterday. Urinary frequency and burning. Nausea. EXAM: CT ABDOMEN AND PELVIS WITH CONTRAST TECHNIQUE: Multidetector CT imaging of the abdomen and pelvis was performed using the standard protocol following bolus administration of intravenous contrast. CONTRAST:  100 ml ISOVUE-300 IOPAMIDOL (ISOVUE-300) INJECTION 61% COMPARISON:  None. FINDINGS: Lower chest: Lung bases are clear. No pleural or pericardial effusion. Hepatobiliary: No focal liver abnormality is seen. No gallstones, gallbladder wall thickening, or biliary dilatation. Pancreas: Unremarkable. No pancreatic ductal dilatation or surrounding inflammatory changes. Spleen: Normal in size without focal abnormality. Adrenals/Urinary Tract: Adrenal glands are unremarkable. Kidneys are normal, without renal calculi, focal lesion, or hydronephrosis. Bladder is unremarkable. Stomach/Bowel: Extensive inflammatory change is present about the cecum. There is and ill-defined fluid collection off the tip of the cecum. A 0.8 cm in diameter calcification is seen adjacent to the cecum at the expected location of the appendiceal orifice. The colon is otherwise unremarkable. The stomach and small bowel appear normal. Vascular/Lymphatic: No significant vascular findings are present. No enlarged abdominal or pelvic lymph nodes. Reproductive: Uterus and bilateral adnexa are unremarkable. Other: Small volume of free pelvic fluid noted. Musculoskeletal: Negative. IMPRESSION: Findings most worrisome for acute appendicitis with phlegmon formation adjacent to the cecum and an  appendicolith measuring 0.8 cm in diameter. Cecitis is possible but thought less likely. These results were called by telephone at the time of interpretation on 09/02/2017 at 9:42 pm to DR. REES, Who verbally acknowledged these results. Electronically Signed   By: Drusilla Kanner M.D.   On: 09/02/2017 21:46    Procedures Dr. Derrell Lolling (09/03/17) - Laparoscopic Appendectomy  Hospital Course:  Patient is a 37 year old female who presented to [facility] with abdominal pain.  Workup showed acute appendicitis.  Patient was admitted and underwent procedure listed above.  Tolerated procedure well and was transferred to the floor.  Diet was advanced as tolerated.  On POD#1, the patient was voiding well, tolerating diet, ambulating well, pain well controlled, vital signs stable, incisions c/d/i and felt stable for discharge home.  Patient will follow up in our office in 2 weeks and knows to call with questions or concerns.  She will call to confirm appointment date/time.    Physical Exam: General:  Alert, NAD, pleasant, comfortable Abd:  Soft, ND, mild tenderness, incisions C/D/I  I have personally looked this patient up in the [place] Controlled Substance Database and reviewed their medications.   Allergies as of 09/04/2017   No Known Allergies     Medication List    STOP taking these medications   triamcinolone lotion 0.1 % Commonly known as:  KENALOG     TAKE these medications   acetaminophen 325 MG tablet Commonly known as:  TYLENOL Take 2 tablets (650 mg total) by mouth every 6 (six) hours.   ibuprofen 400 MG tablet Commonly known as:  ADVIL,MOTRIN Take 1 tablet (400 mg total) by mouth every 6 (six) hours.   levothyroxine 75 MCG tablet Commonly known as:  SYNTHROID, LEVOTHROID Take 1 tablet (75 mcg total) by mouth daily before breakfast.   traMADol 50 MG tablet Commonly known as:  ULTRAM Take 1 tablet (50 mg total) by mouth every 6 (  six) hours as needed for severe pain.        Follow-up  Information    [facility]. Call.   Specialty:  General Surgery Why:  Call to confirm appointment date/time. Bring photo ID and insurance information. Please arrive 30 min prior to appointment time.  Contact information: [address] [place] [address] [phone]           Signed: Wells GuilesKelly Rayburn, [facility] 09/04/2017, 8:55 AM Pager: [phone] Consults: [phone] Mon-Fri 7:00 am-4:30 pm Sat-Sun 7:00 am-11:30 am

## 2017-09-04 NOTE — Discharge Instructions (Signed)

## 2017-09-04 NOTE — Progress Notes (Signed)
Written and verbal discharge instructions provided to the patient and her husband.  They both verbalize understanding instructions.  Patient has voided this morning, passing flatus and has tolerated ambulation well.  Discharge via wheel chair with her husband.  Patient has follow up appointment scheduled.

## 2017-09-05 ENCOUNTER — Telehealth: Payer: Self-pay

## 2017-09-05 NOTE — Telephone Encounter (Addendum)
Transition Care Management Follow-Up Telephone Call   Date discharged and where: [facility] on 09/04/2017  How have you been since you were released from the hospital? In moderate pain at surgical site  Any patient concerns? None  Items Reviewed:   Meds: Y, taking tramadol for the pain  Allergies: Y  Dietary Changes Reviewed: Y  Functional Questionnaire:  Independent-I Dependent-D  ADLs:   Dressing- I    Eating- I   Maintaining continence- I   Transferring- I   Transportation- D-still recovering from surger   Meal Prep- I   Managing Meds- I  Confirmed importance and Date/Time of follow-up visits scheduled: Yes, patient stated she will call to make her surgeon follow up appointment   Confirmed with patient if condition worsens to call PCP or go to the Emergency Dept. Patient was given office number and encouraged to call back with questions or concerns: Yes

## 2018-01-14 ENCOUNTER — Encounter: Payer: Self-pay | Admitting: Internal Medicine

## 2018-07-28 ENCOUNTER — Ambulatory Visit
Admission: RE | Admit: 2018-07-28 | Discharge: 2018-07-28 | Disposition: A | Discharge status: Home / Self Care | Payer: Managed Care, Other (non HMO) | Source: Ambulatory Visit | Attending: Family Medicine | Admitting: Family Medicine

## 2018-07-28 ENCOUNTER — Other Ambulatory Visit: Payer: Self-pay | Admitting: Family Medicine

## 2018-07-28 DIAGNOSIS — R1011 Right upper quadrant pain: Secondary | ICD-10-CM

## 2018-08-17 ENCOUNTER — Other Ambulatory Visit (HOSPITAL_COMMUNITY): Payer: Self-pay | Admitting: Gastroenterology

## 2018-08-17 ENCOUNTER — Other Ambulatory Visit: Payer: Self-pay | Admitting: Gastroenterology

## 2018-08-17 DIAGNOSIS — R1011 Right upper quadrant pain: Secondary | ICD-10-CM

## 2018-09-25 ENCOUNTER — Ambulatory Visit (HOSPITAL_COMMUNITY): Payer: Managed Care, Other (non HMO)

## 2018-11-05 ENCOUNTER — Other Ambulatory Visit: Payer: Self-pay

## 2018-11-05 ENCOUNTER — Encounter (HOSPITAL_COMMUNITY)
Admission: RE | Admit: 2018-11-05 | Discharge: 2018-11-05 | Disposition: A | Discharge status: Home / Self Care | Payer: Managed Care, Other (non HMO) | Source: Ambulatory Visit | Attending: Gastroenterology | Admitting: Gastroenterology

## 2018-11-05 DIAGNOSIS — R1011 Right upper quadrant pain: Secondary | ICD-10-CM | POA: Diagnosis not present

## 2018-11-05 MED ORDER — TECHNETIUM TC 99M MEBROFENIN IV KIT
5.0000 | PACK | Freq: Once | INTRAVENOUS | Status: AC | PRN
  Administered 2018-11-05: 10:00:00 5 via INTRAVENOUS

## 2018-11-12 ENCOUNTER — Other Ambulatory Visit: Payer: Self-pay | Admitting: Gastroenterology

## 2018-11-12 DIAGNOSIS — R634 Abnormal weight loss: Secondary | ICD-10-CM

## 2018-11-12 DIAGNOSIS — R109 Unspecified abdominal pain: Secondary | ICD-10-CM

## 2018-11-25 ENCOUNTER — Other Ambulatory Visit (HOSPITAL_COMMUNITY): Payer: Self-pay | Admitting: Gastroenterology

## 2018-11-25 DIAGNOSIS — R109 Unspecified abdominal pain: Secondary | ICD-10-CM

## 2018-11-25 DIAGNOSIS — R634 Abnormal weight loss: Secondary | ICD-10-CM

## 2018-12-02 ENCOUNTER — Ambulatory Visit (HOSPITAL_COMMUNITY): Payer: Managed Care, Other (non HMO)

## 2020-10-18 DIAGNOSIS — D649 Anemia, unspecified: Secondary | ICD-10-CM | POA: Diagnosis not present

## 2020-10-18 DIAGNOSIS — E039 Hypothyroidism, unspecified: Secondary | ICD-10-CM | POA: Diagnosis not present

## 2020-10-18 DIAGNOSIS — K219 Gastro-esophageal reflux disease without esophagitis: Secondary | ICD-10-CM | POA: Diagnosis not present

## 2020-10-18 DIAGNOSIS — R1011 Right upper quadrant pain: Secondary | ICD-10-CM | POA: Diagnosis not present

## 2020-10-20 ENCOUNTER — Other Ambulatory Visit: Payer: Self-pay | Admitting: Family Medicine

## 2020-10-20 DIAGNOSIS — R1011 Right upper quadrant pain: Secondary | ICD-10-CM

## 2020-11-29 DIAGNOSIS — R790 Abnormal level of blood mineral: Secondary | ICD-10-CM | POA: Diagnosis not present

## 2020-11-29 DIAGNOSIS — E039 Hypothyroidism, unspecified: Secondary | ICD-10-CM | POA: Diagnosis not present

## 2021-02-06 DIAGNOSIS — E039 Hypothyroidism, unspecified: Secondary | ICD-10-CM | POA: Diagnosis not present

## 2021-05-08 DIAGNOSIS — Z0184 Encounter for antibody response examination: Secondary | ICD-10-CM | POA: Diagnosis not present

## 2021-07-09 DIAGNOSIS — Z111 Encounter for screening for respiratory tuberculosis: Secondary | ICD-10-CM | POA: Diagnosis not present

## 2021-07-09 DIAGNOSIS — Z23 Encounter for immunization: Secondary | ICD-10-CM | POA: Diagnosis not present

## 2021-07-25 DIAGNOSIS — E611 Iron deficiency: Secondary | ICD-10-CM | POA: Diagnosis not present

## 2021-07-25 DIAGNOSIS — E039 Hypothyroidism, unspecified: Secondary | ICD-10-CM | POA: Diagnosis not present

## 2021-07-25 DIAGNOSIS — E538 Deficiency of other specified B group vitamins: Secondary | ICD-10-CM | POA: Diagnosis not present

## 2021-07-25 DIAGNOSIS — K219 Gastro-esophageal reflux disease without esophagitis: Secondary | ICD-10-CM | POA: Diagnosis not present

## 2021-07-25 DIAGNOSIS — Z0000 Encounter for general adult medical examination without abnormal findings: Secondary | ICD-10-CM | POA: Diagnosis not present

## 2021-07-25 DIAGNOSIS — R109 Unspecified abdominal pain: Secondary | ICD-10-CM | POA: Diagnosis not present

## 2021-09-21 ENCOUNTER — Other Ambulatory Visit: Payer: Self-pay | Admitting: Family Medicine

## 2021-09-21 DIAGNOSIS — Z1231 Encounter for screening mammogram for malignant neoplasm of breast: Secondary | ICD-10-CM

## 2021-10-05 ENCOUNTER — Ambulatory Visit
Admission: RE | Admit: 2021-10-05 | Discharge: 2021-10-05 | Disposition: A | Discharge status: Home / Self Care | Payer: BC Managed Care – PPO | Source: Ambulatory Visit | Attending: Family Medicine | Admitting: Family Medicine

## 2021-10-05 DIAGNOSIS — Z1231 Encounter for screening mammogram for malignant neoplasm of breast: Secondary | ICD-10-CM | POA: Diagnosis not present

## 2022-02-19 DIAGNOSIS — N959 Unspecified menopausal and perimenopausal disorder: Secondary | ICD-10-CM | POA: Diagnosis not present

## 2022-02-19 DIAGNOSIS — Z6824 Body mass index (BMI) 24.0-24.9, adult: Secondary | ICD-10-CM | POA: Diagnosis not present

## 2022-02-19 DIAGNOSIS — Z0142 Encounter for cervical smear to confirm findings of recent normal smear following initial abnormal smear: Secondary | ICD-10-CM | POA: Diagnosis not present

## 2022-02-19 DIAGNOSIS — Z01419 Encounter for gynecological examination (general) (routine) without abnormal findings: Secondary | ICD-10-CM | POA: Diagnosis not present

## 2022-02-26 LAB — HM PAP SMEAR
HM Pap smear: NORMAL
HPV, high-risk: NEGATIVE

## 2022-04-05 ENCOUNTER — Other Ambulatory Visit: Payer: Self-pay | Admitting: Gastroenterology

## 2022-04-05 DIAGNOSIS — R1013 Epigastric pain: Secondary | ICD-10-CM | POA: Diagnosis not present

## 2022-04-05 DIAGNOSIS — R1011 Right upper quadrant pain: Secondary | ICD-10-CM | POA: Diagnosis not present

## 2022-04-05 DIAGNOSIS — R109 Unspecified abdominal pain: Secondary | ICD-10-CM | POA: Diagnosis not present

## 2022-04-05 DIAGNOSIS — K5900 Constipation, unspecified: Secondary | ICD-10-CM | POA: Diagnosis not present

## 2022-04-05 DIAGNOSIS — R10816 Epigastric abdominal tenderness: Secondary | ICD-10-CM | POA: Diagnosis not present

## 2022-04-05 DIAGNOSIS — R140 Abdominal distension (gaseous): Secondary | ICD-10-CM | POA: Diagnosis not present

## 2022-04-05 DIAGNOSIS — R1084 Generalized abdominal pain: Secondary | ICD-10-CM

## 2022-04-05 DIAGNOSIS — K219 Gastro-esophageal reflux disease without esophagitis: Secondary | ICD-10-CM | POA: Diagnosis not present

## 2022-04-25 ENCOUNTER — Other Ambulatory Visit: Payer: Self-pay | Admitting: Gastroenterology

## 2022-04-25 DIAGNOSIS — R1030 Lower abdominal pain, unspecified: Secondary | ICD-10-CM

## 2022-05-16 ENCOUNTER — Ambulatory Visit
Admission: RE | Admit: 2022-05-16 | Discharge: 2022-05-16 | Disposition: A | Discharge status: Home / Self Care | Payer: BC Managed Care – PPO | Source: Ambulatory Visit | Attending: Gastroenterology | Admitting: Gastroenterology

## 2022-05-16 DIAGNOSIS — R1030 Lower abdominal pain, unspecified: Secondary | ICD-10-CM

## 2022-05-16 MED ORDER — IOPAMIDOL (ISOVUE-300) INJECTION 61%
100.0000 mL | Freq: Once | INTRAVENOUS | Status: AC | PRN
  Administered 2022-05-16: 100 mL via INTRAVENOUS

## 2022-08-14 NOTE — Progress Notes (Unsigned)
HPI: ColleenJalen Henderson is a 42 y.o. female, who is here today to establish care.  Former PCP: Colleen Stalker, PA Last preventive routine visit: 11/2016.  Chronic medical problems: ***  Concerns today: *** Lab Results  Component Value Date   TSH 2.60 12/17/2016      Latest Ref Rng & Units 09/02/2017    8:22 PM 12/17/2016    8:16 AM 11/01/2015   10:55 AM  CBC  WBC 4.0 - 10.5 K/uL 10.3  5.0  5.2   Hemoglobin 12.0 - 15.0 g/dL 11.0  12.1  12.5   Hematocrit 36.0 - 46.0 % 34.9  38.2  37.1   Platelets 150 - 400 K/uL 321  339  386     Lab Results  Component Value Date   CREATININE 0.73 09/02/2017   BUN 8 09/02/2017   NA 136 09/02/2017   K 3.6 09/02/2017   CL 104 09/02/2017   CO2 22 09/02/2017   Review of Systems See other pertinent positives and negatives in HPI.  Current Outpatient Medications on File Prior to Visit  Medication Sig Dispense Refill   levothyroxine (SYNTHROID) 88 MCG tablet Take 88 mcg by mouth daily before breakfast.     pantoprazole (PROTONIX) 40 MG tablet Take 40 mg by mouth daily.     UNABLE TO FIND Med Name: Nutrafol 4 tablets daily.     No current facility-administered medications on file prior to visit.   Past Medical History:  Diagnosis Date   Hypothyroidism 09/22/2013   No Known Allergies  Family History  Problem Relation Age of Onset   Diabetes Mother    Diabetes Father     Social History   Socioeconomic History   Marital status: Married    Spouse name: Not on file   Number of children: Not on file   Years of education: Not on file   Highest education level: Not on file  Occupational History   Not on file  Tobacco Use   Smoking status: Never   Smokeless tobacco: Never  Vaping Use   Vaping Use: Never used  Substance and Sexual Activity   Alcohol use: No   Drug use: No   Sexual activity: Not on file  Other Topics Concern   Not on file  Social History Narrative   Not on file   Social Determinants of Health   Financial  Resource Strain: Not on file  Food Insecurity: Not on file  Transportation Needs: Not on file  Physical Activity: Not on file  Stress: Not on file  Social Connections: Not on file    Vitals:   08/16/22 1458  BP: 110/70  Pulse: 70  SpO2: 98%    Body mass index is 23.44 kg/m.  Physical Exam Vitals and nursing note reviewed.  Constitutional:      General: She is not in acute distress.    Appearance: She is well-developed.  HENT:     Head: Normocephalic and atraumatic.     Mouth/Throat:     Mouth: Mucous membranes are moist.     Pharynx: Oropharynx is clear.  Eyes:     Conjunctiva/sclera: Conjunctivae normal.  Cardiovascular:     Rate and Rhythm: Normal rate and regular rhythm.     Pulses:          Dorsalis pedis pulses are 2+ on the right side and 2+ on the left side.     Heart sounds: No murmur heard. Pulmonary:     Effort: Pulmonary effort is normal.  No respiratory distress.     Breath sounds: Normal breath sounds.  Abdominal:     Palpations: Abdomen is soft. There is no hepatomegaly or mass.     Tenderness: There is no abdominal tenderness.  Musculoskeletal:     Right hip: Tenderness present. No deformity or crepitus. Normal range of motion. Normal strength.       Legs:  Lymphadenopathy:     Cervical: No cervical adenopathy.  Skin:    General: Skin is warm.     Findings: No erythema or rash.  Neurological:     General: No focal deficit present.     Mental Status: She is alert and oriented to person, place, and time.     Cranial Nerves: No cranial nerve deficit.     Gait: Gait normal.  Psychiatric:     Comments: Well groomed, good eye contact.     ASSESSMENT AND PLAN: Colleen Henderson was seen today for establish care.  Diagnoses and all orders for this visit:  Iron deficiency anemia, unspecified iron deficiency anemia type -     CBC; Future -     Iron -     Ferritin  B12 deficiency -     Cancel: Vitamin B12; Future -     Vitamin B12; Future  Trochanteric  bursitis of right hip    Iron deficiency anemia, unspecified iron deficiency anemia type -     CBC; Future -     Iron -     Ferritin  B12 deficiency -     Vitamin B12; Future  Trochanteric bursitis of right hip    Return in about 1 year (around 08/16/2023), or if symptoms worsen or fail to improve.  Jedediah Noda G. [place], MD  [facility]. [place] office.

## 2022-08-16 ENCOUNTER — Encounter: Payer: Self-pay | Admitting: Family Medicine

## 2022-08-16 ENCOUNTER — Ambulatory Visit: Payer: BC Managed Care – PPO | Admitting: Family Medicine

## 2022-08-16 ENCOUNTER — Ambulatory Visit (INDEPENDENT_AMBULATORY_CARE_PROVIDER_SITE_OTHER): Payer: Managed Care, Other (non HMO) | Admitting: Family Medicine

## 2022-08-16 VITALS — BP 110/70 | HR 70 | Resp 12 | Ht 66.0 in | Wt 145.2 lb

## 2022-08-16 DIAGNOSIS — M7061 Trochanteric bursitis, right hip: Secondary | ICD-10-CM | POA: Diagnosis not present

## 2022-08-16 DIAGNOSIS — E039 Hypothyroidism, unspecified: Secondary | ICD-10-CM

## 2022-08-16 DIAGNOSIS — R1011 Right upper quadrant pain: Secondary | ICD-10-CM | POA: Diagnosis not present

## 2022-08-16 DIAGNOSIS — E538 Deficiency of other specified B group vitamins: Secondary | ICD-10-CM

## 2022-08-16 DIAGNOSIS — D509 Iron deficiency anemia, unspecified: Secondary | ICD-10-CM

## 2022-08-16 NOTE — Patient Instructions (Addendum)
A few things to remember from today's visit:  Iron deficiency anemia, unspecified iron deficiency anemia type - Plan: CBC, Iron, Ferritin  B12 deficiency - Plan: Vitamin B12, CANCELED: Vitamin B12  Trochanteric bursitis of right hip  If you need refills for medications you take chronically, please call your pharmacy. Do not use My Chart to request refills or for acute issues that need immediate attention. If you send a my chart message, it may take a few days to be addressed, specially if I am not in the office.  Please be sure medication list is accurate. If a new problem present, please set up appointment sooner than planned today.

## 2022-08-17 LAB — FERRITIN: Ferritin: 8 ng/mL — ABNORMAL LOW (ref 15–150)

## 2022-08-17 LAB — CBC
Hematocrit: 38.5 % (ref 34.0–46.6)
Hemoglobin: 12.1 g/dL (ref 11.1–15.9)
MCH: 27.5 pg (ref 26.6–33.0)
MCHC: 31.4 g/dL — ABNORMAL LOW (ref 31.5–35.7)
MCV: 88 fL (ref 79–97)
Platelets: 401 10*3/uL (ref 150–450)
RBC: 4.4 x10E6/uL (ref 3.77–5.28)
RDW: 13.1 % (ref 11.7–15.4)
WBC: 5.8 10*3/uL (ref 3.4–10.8)

## 2022-08-17 LAB — IRON: Iron: 53 ug/dL (ref 27–159)

## 2022-08-17 LAB — VITAMIN B12: Vitamin B-12: 467 pg/mL (ref 232–1245)

## 2022-08-18 MED ORDER — FERROUS SULFATE 325 (65 FE) MG PO TABS: ORAL_TABLET | ORAL | 3 refills | Status: DC

## 2022-08-18 NOTE — Assessment & Plan Note (Signed)
She is not on B12 supplementation. ?Further recommendations according to B12 result. ?

## 2022-08-18 NOTE — Assessment & Plan Note (Signed)
Problem is well controlled. TSH 1.2 in 03/2022. Continue Levothyroxine 88 mcg daily.

## 2022-08-18 NOTE — Assessment & Plan Note (Addendum)
Mild. Not on iron supplementation. Reports colonoscopy done and negative overall. Has not had EGD. Currently on PPI. Attributed to heavy menses. Further recommendations according to CBC and iron studies.

## 2022-08-18 NOTE — Assessment & Plan Note (Signed)
Chronic since 2019 when she underwent appendicectomy. No clear etiology has been determined. Problem is stable and she has identified exacerbating factors, so continue avoiding foods that aggravate problem. Follows with gastroenterologist.

## 2022-08-30 ENCOUNTER — Telehealth: Payer: Self-pay | Admitting: Family Medicine

## 2022-08-30 DIAGNOSIS — D509 Iron deficiency anemia, unspecified: Secondary | ICD-10-CM

## 2022-08-30 NOTE — Telephone Encounter (Signed)
Pt husband is calling  and correction pt did not have colonoscopy she had endoscopy . Pt husband is calling and would like his wife to have colonoscopy

## 2023-01-19 IMAGING — MG MM DIGITAL SCREENING BILAT W/ TOMO AND CAD
8 series · 9 of 24 positions shown · non-contrast
Comparison: None.

CLINICAL DATA: Screening. This is the patient's initial baseline
mammogram.

EXAM:
DIGITAL SCREENING BILATERAL MAMMOGRAM WITH TOMOSYNTHESIS AND CAD
TECHNIQUE: Bilateral screening digital craniocaudal and mediolateral oblique
mammograms were obtained. Bilateral screening digital breast
tomosynthesis was performed. The images were evaluated with
computer-aided detection.

[L CC synth-2D]
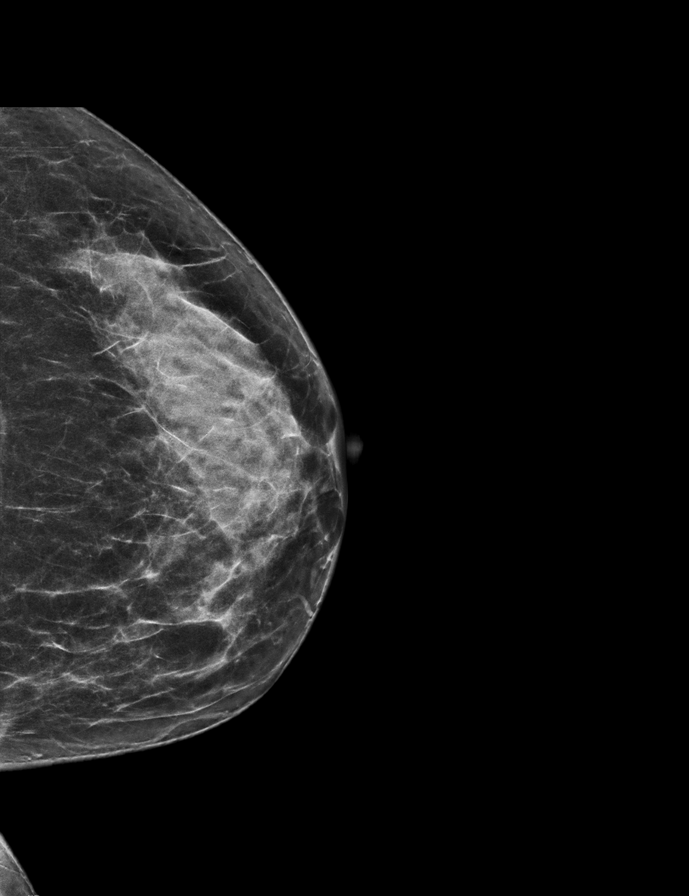

[R CC synth-2D]
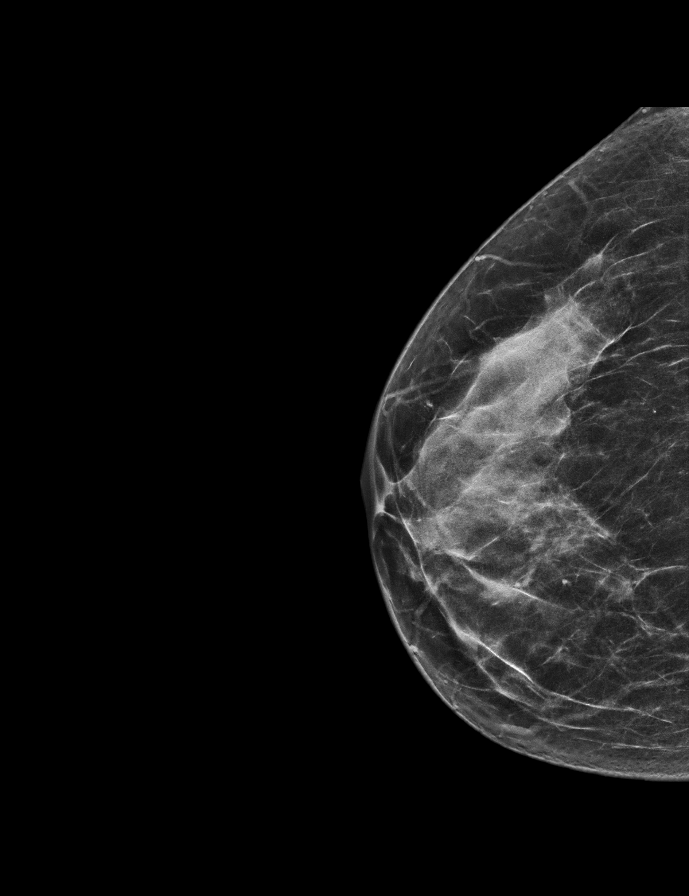

[R MLO synth-2D]
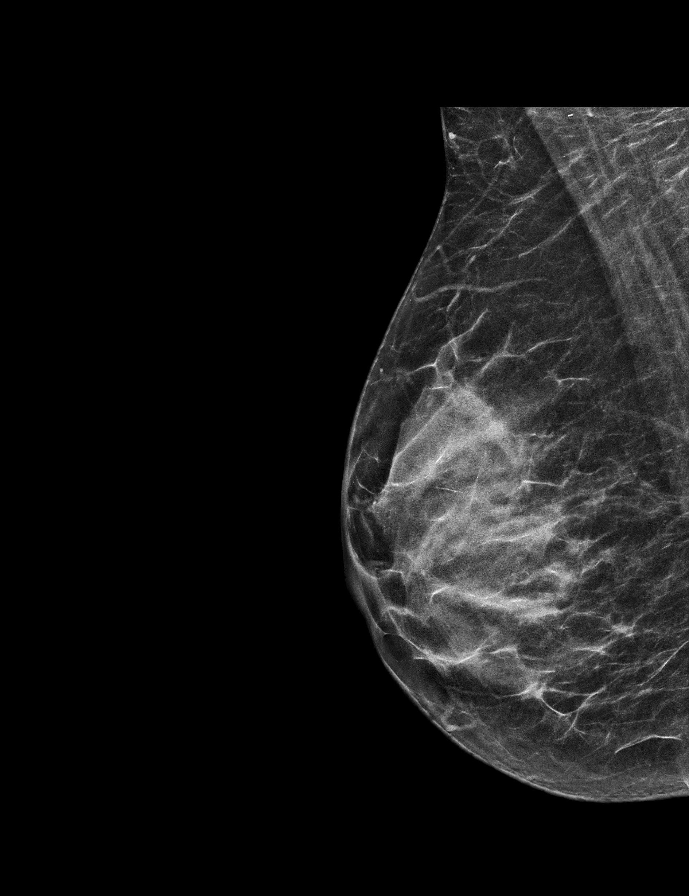

[L MLO synth-2D]
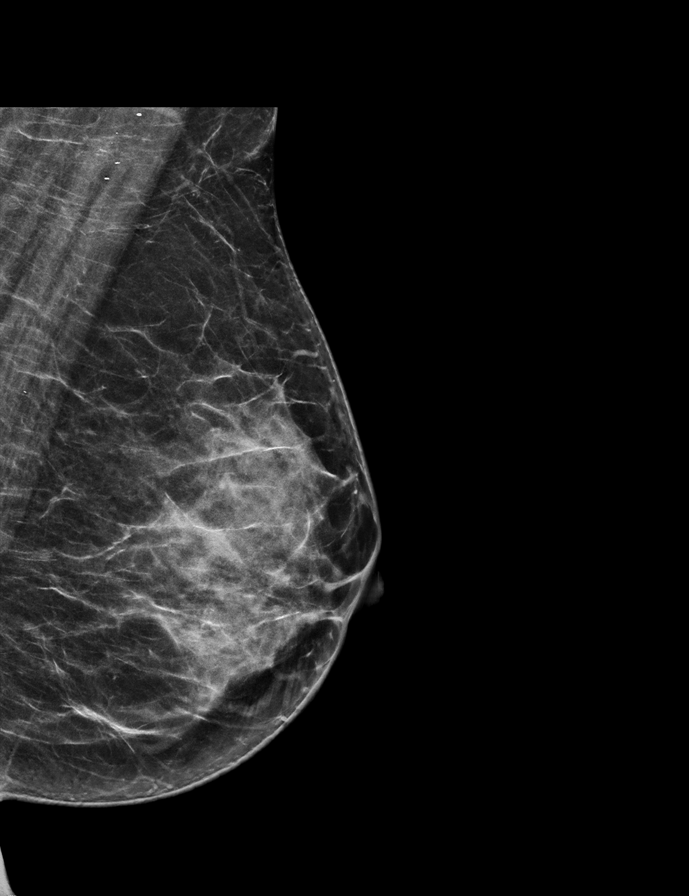

[L CC tomo · 2 of 58 frames shown]
[frame 19/58]
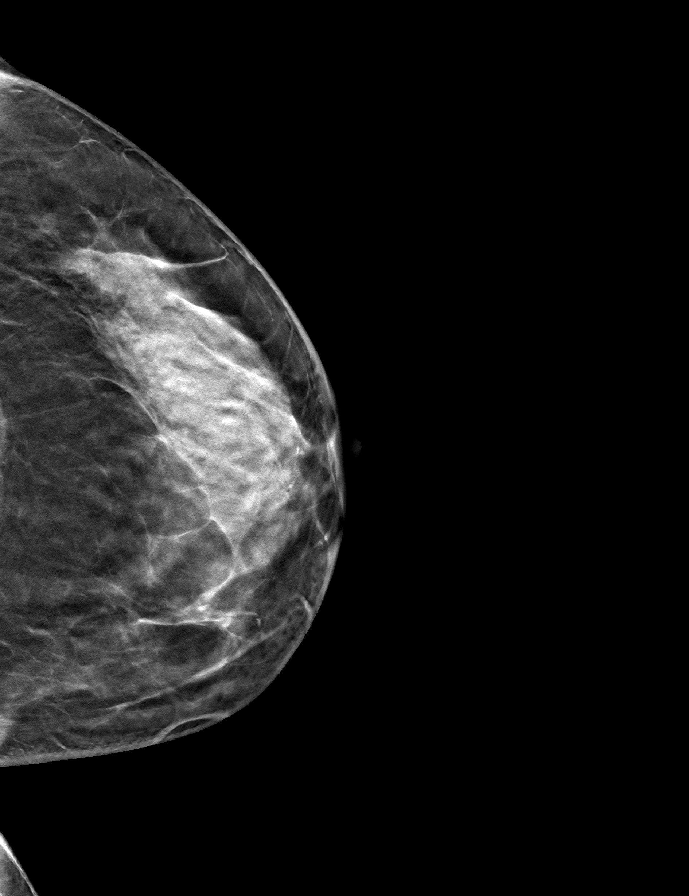
[frame 29/58]
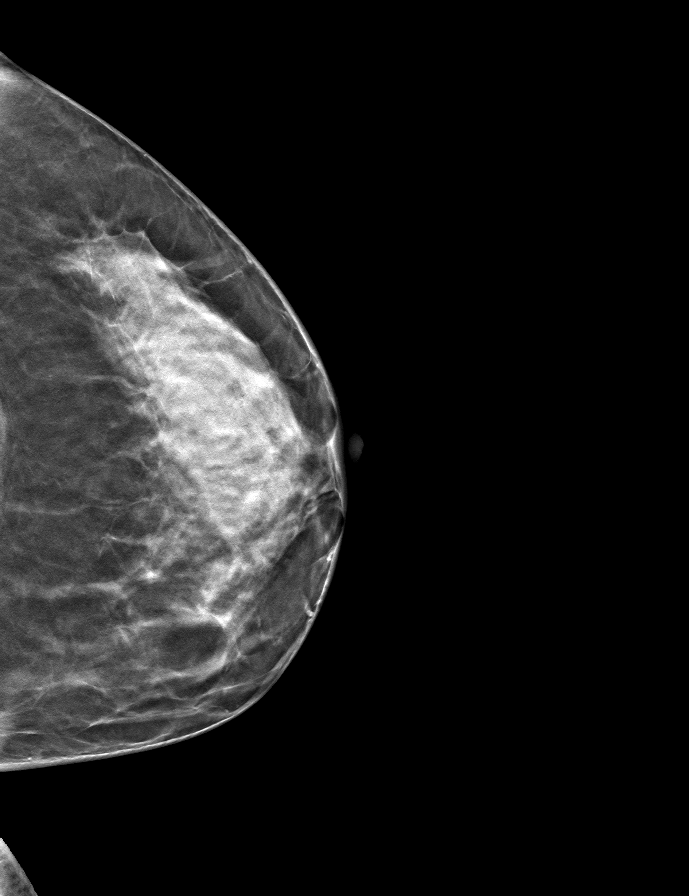

[R MLO tomo · tomo slice 28/55.0]
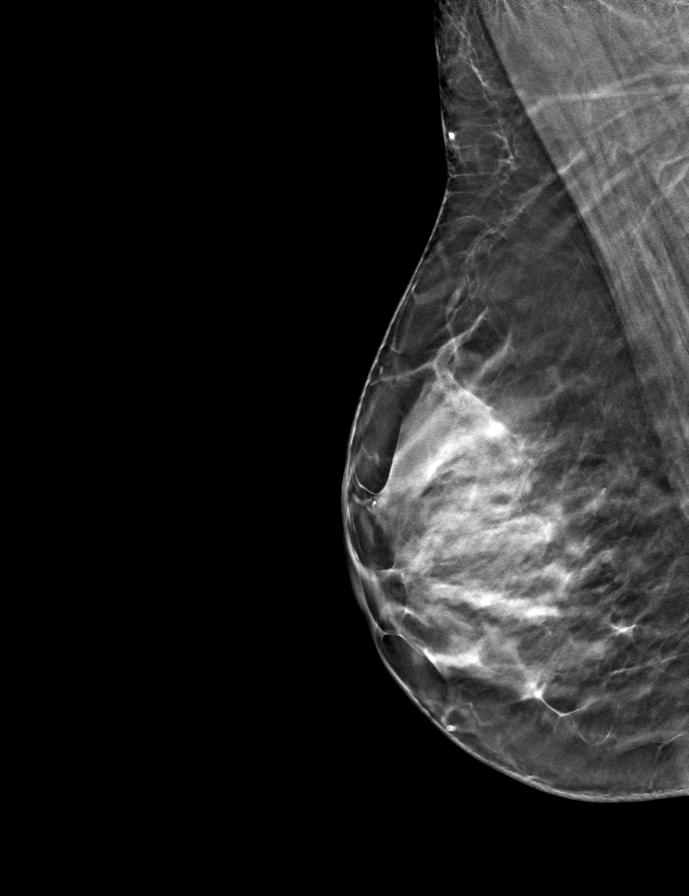

[R CC tomo · tomo slice 28/55.0]
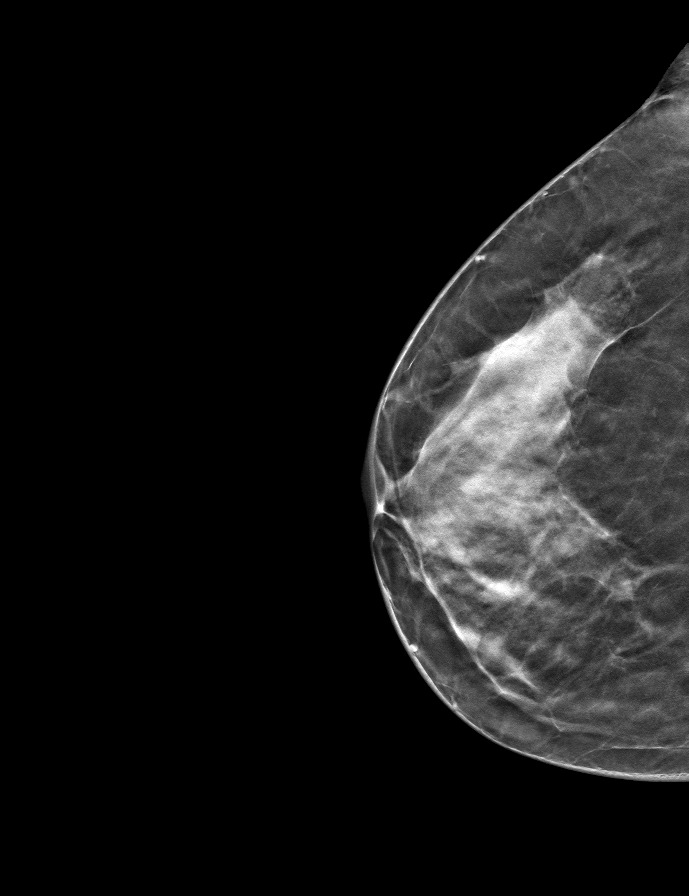

[L MLO tomo · tomo slice 30/59.0]
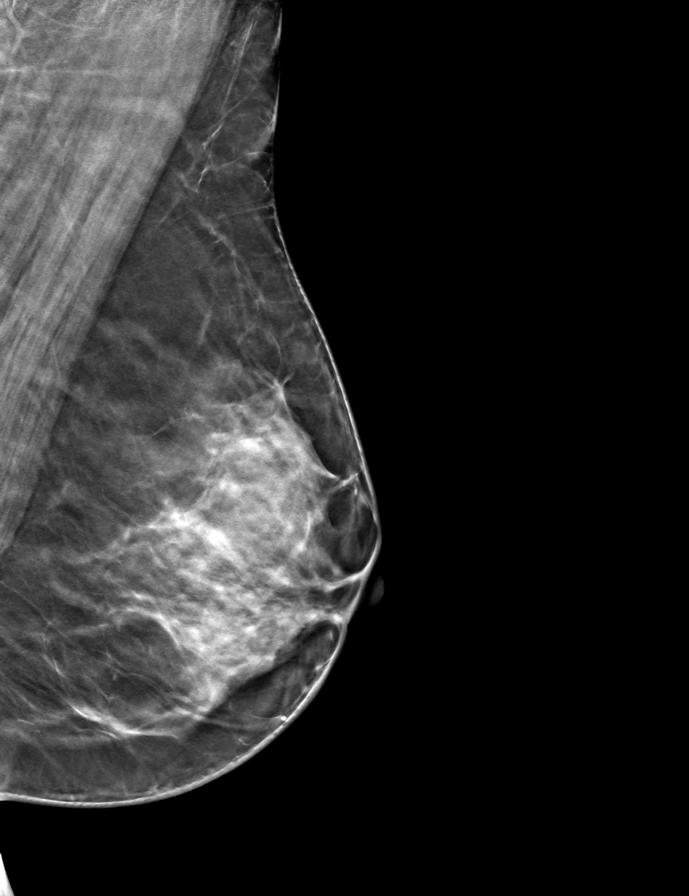

[9 of 24 positions shown; findings below may reference images not displayed]

ACR Breast Density Category d: The breast tissue is extremely dense,
which lowers the sensitivity of mammography.
FINDINGS: There are no findings suspicious for malignancy.
IMPRESSION: No mammographic evidence of malignancy. A result letter of this
screening mammogram will be mailed directly to the patient.

RECOMMENDATION:
Screening mammogram in one year. (Code:HX-B-SRM)

BI-RADS CATEGORY  1: Negative.

## 2023-03-24 ENCOUNTER — Telehealth: Payer: Self-pay | Admitting: Family Medicine

## 2023-03-24 MED ORDER — LEVOTHYROXINE SODIUM 88 MCG PO TABS: 88.0000 ug | ORAL_TABLET | Freq: Every day | ORAL | 1 refills | Status: DC

## 2023-03-24 NOTE — Telephone Encounter (Signed)
Prescription Request  03/24/2023  LOV: 08/16/2022  What is the name of the medication or equipment? levothyroxine (SYNTHROID) 88 MCG tablet   Have you contacted your pharmacy to request a refill? Yes   Which pharmacy would you like this sent to?   [facility] [address], [place] - [address] [place] [address] Phone: [phone] Fax: [phone]    Patient notified that their request is being sent to the clinical staff for review and that they should receive a response within 2 business days.   Please advise at Mobile [phone] (mobile)

## 2023-03-24 NOTE — Telephone Encounter (Signed)
Rx sent 

## 2023-08-04 ENCOUNTER — Encounter: Admitting: Family Medicine

## 2023-08-18 ENCOUNTER — Encounter: Payer: Self-pay | Admitting: Family Medicine

## 2023-08-18 ENCOUNTER — Ambulatory Visit (INDEPENDENT_AMBULATORY_CARE_PROVIDER_SITE_OTHER): Admitting: Family Medicine

## 2023-08-18 VITALS — BP 118/80 | HR 70 | Temp 98.1°F | Resp 12 | Ht 66.0 in | Wt 147.0 lb

## 2023-08-18 DIAGNOSIS — Z130 Encounter for screening for diseases of the blood and blood-forming organs and certain disorders involving the immune mechanism: Secondary | ICD-10-CM

## 2023-08-18 DIAGNOSIS — Z0000 Encounter for general adult medical examination without abnormal findings: Secondary | ICD-10-CM

## 2023-08-18 DIAGNOSIS — Z1329 Encounter for screening for other suspected endocrine disorder: Secondary | ICD-10-CM

## 2023-08-18 DIAGNOSIS — E039 Hypothyroidism, unspecified: Secondary | ICD-10-CM

## 2023-08-18 DIAGNOSIS — Z13220 Encounter for screening for lipoid disorders: Secondary | ICD-10-CM

## 2023-08-18 DIAGNOSIS — Z1159 Encounter for screening for other viral diseases: Secondary | ICD-10-CM

## 2023-08-18 DIAGNOSIS — Z23 Encounter for immunization: Secondary | ICD-10-CM | POA: Diagnosis not present

## 2023-08-18 DIAGNOSIS — Z13228 Encounter for screening for other metabolic disorders: Secondary | ICD-10-CM | POA: Diagnosis not present

## 2023-08-18 DIAGNOSIS — E538 Deficiency of other specified B group vitamins: Secondary | ICD-10-CM | POA: Diagnosis not present

## 2023-08-18 DIAGNOSIS — D509 Iron deficiency anemia, unspecified: Secondary | ICD-10-CM | POA: Diagnosis not present

## 2023-08-18 LAB — BASIC METABOLIC PANEL
BUN: 11 mg/dL (ref 6–23)
CO2: 25 meq/L (ref 19–32)
Calcium: 9.8 mg/dL (ref 8.4–10.5)
Chloride: 103 meq/L (ref 96–112)
Creatinine, Ser: 0.69 mg/dL (ref 0.40–1.20)
GFR: 107.34 mL/min (ref >60.00)
Glucose, Bld: 84 mg/dL (ref 70–99)
Potassium: 4.2 meq/L (ref 3.5–5.1)
Sodium: 136 meq/L (ref 135–145)

## 2023-08-18 LAB — CBC
HCT: 38 % (ref 36.0–46.0)
Hemoglobin: 12.3 g/dL (ref 12.0–15.0)
MCHC: 32.3 g/dL (ref 30.0–36.0)
MCV: 84.5 fl (ref 78.0–100.0)
Platelets: 433 10*3/uL — ABNORMAL HIGH (ref 150.0–400.0)
RBC: 4.5 Mil/uL (ref 3.87–5.11)
RDW: 15.2 % (ref 11.5–15.5)
WBC: 5.6 10*3/uL (ref 4.0–10.5)

## 2023-08-18 LAB — LIPID PANEL
Cholesterol: 155 mg/dL (ref 0–200)
HDL: 66.7 mg/dL (ref >39.00)
LDL Cholesterol: 76 mg/dL (ref 0–99)
NonHDL: 88.73
Total CHOL/HDL Ratio: 2
Triglycerides: 63 mg/dL (ref 0.0–149.0)
VLDL: 12.6 mg/dL (ref 0.0–40.0)

## 2023-08-18 LAB — HEMOGLOBIN A1C: Hgb A1c MFr Bld: 5.7 % (ref 4.6–6.5)

## 2023-08-18 LAB — TSH: TSH: 0.23 u[IU]/mL — ABNORMAL LOW (ref 0.35–5.50)

## 2023-08-18 LAB — FERRITIN: Ferritin: 3.9 ng/mL — ABNORMAL LOW (ref 10.0–291.0)

## 2023-08-18 LAB — T4, FREE: Free T4: 1.36 ng/dL (ref 0.60–1.60)

## 2023-08-18 LAB — VITAMIN B12: Vitamin B-12: 183 pg/mL — ABNORMAL LOW (ref 211–911)

## 2023-08-18 LAB — IRON: Iron: 83 ug/dL (ref 42–145)

## 2023-08-18 MED ORDER — LEVOTHYROXINE SODIUM 88 MCG PO TABS
88.0000 ug | ORAL_TABLET | Freq: Every day | ORAL | 1 refills | Status: DC
Start: 2023-08-18 — End: 2023-10-09

## 2023-08-18 NOTE — Patient Instructions (Addendum)
 A few things to remember from today's visit:  Routine general medical examination at a health care facility  B12 deficiency - Plan: Vitamin B12  Acquired hypothyroidism - Plan: T4, free, TSH  Iron deficiency anemia, unspecified iron deficiency anemia type - Plan: Iron, Ferritin, CBC  Encounter for HCV screening test for low risk patient  Screening for lipoid disorders - Plan: Lipid panel, Hepatitis C antibody  Screening for endocrine, metabolic and immunity disorder - Plan: Hemoglobin A1c, Basic metabolic panel Due for mammogram. We need to have last pap smear report.  If you need refills for medications you take chronically, please call your pharmacy. Do not use My Chart to request refills or for acute issues that need immediate attention. If you send a my chart message, it may take a few days to be addressed, specially if I am not in the office.  Please be sure medication list is accurate. If a new problem present, please set up appointment sooner than planned today.  Health Maintenance, Female Adopting a healthy lifestyle and getting preventive care are important in promoting health and wellness. Ask your health care provider about: The right schedule for you to have regular tests and exams. Things you can do on your own to prevent diseases and keep yourself healthy. What should I know about diet, weight, and exercise? Eat a healthy diet  Eat a diet that includes plenty of vegetables, fruits, low-fat dairy products, and lean protein. Do not eat a lot of foods that are high in solid fats, added sugars, or sodium. Maintain a healthy weight Body mass index (BMI) is used to identify weight problems. It estimates body fat based on height and weight. Your health care provider can help determine your BMI and help you achieve or maintain a healthy weight. Get regular exercise Get regular exercise. This is one of the most important things you can do for your health. Most adults  should: Exercise for at least 150 minutes each week. The exercise should increase your heart rate and make you sweat (moderate-intensity exercise). Do strengthening exercises at least twice a week. This is in addition to the moderate-intensity exercise. Spend less time sitting. Even light physical activity can be beneficial. Watch cholesterol and blood lipids Have your blood tested for lipids and cholesterol at 42 years of age, then have this test every 5 years. Have your cholesterol levels checked more often if: Your lipid or cholesterol levels are high. You are older than 42 years of age. You are at high risk for heart disease. What should I know about cancer screening? Depending on your health history and family history, you may need to have cancer screening at various ages. This may include screening for: Breast cancer. Cervical cancer. Colorectal cancer. Skin cancer. Lung cancer. What should I know about heart disease, diabetes, and high blood pressure? Blood pressure and heart disease High blood pressure causes heart disease and increases the risk of stroke. This is more likely to develop in people who have high blood pressure readings or are overweight. Have your blood pressure checked: Every 3-5 years if you are 88-4 years of age. Every year if you are 19 years old or older. Diabetes Have regular diabetes screenings. This checks your fasting blood sugar level. Have the screening done: Once every three years after age 8 if you are at a normal weight and have a low risk for diabetes. More often and at a younger age if you are overweight or have a high risk for  diabetes. What should I know about preventing infection? Hepatitis B If you have a higher risk for hepatitis B, you should be screened for this virus. Talk with your health care provider to find out if you are at risk for hepatitis B infection. Hepatitis C Testing is recommended for: Everyone born from [birth year] through  1965. Anyone with known risk factors for hepatitis C. Sexually transmitted infections (STIs) Get screened for STIs, including gonorrhea and chlamydia, if: You are sexually active and are younger than 42 years of age. You are older than 42 years of age and your health care provider tells you that you are at risk for this type of infection. Your sexual activity has changed since you were last screened, and you are at increased risk for chlamydia or gonorrhea. Ask your health care provider if you are at risk. Ask your health care provider about whether you are at high risk for HIV. Your health care provider may recommend a prescription medicine to help prevent HIV infection. If you choose to take medicine to prevent HIV, you should first get tested for HIV. You should then be tested every 3 months for as long as you are taking the medicine. Pregnancy If you are about to stop having your period (premenopausal) and you may become pregnant, seek counseling before you get pregnant. Take 400 to 800 micrograms (mcg) of folic acid every day if you become pregnant. Ask for birth control (contraception) if you want to prevent pregnancy. Osteoporosis and menopause Osteoporosis is a disease in which the bones lose minerals and strength with aging. This can result in bone fractures. If you are 72 years old or older, or if you are at risk for osteoporosis and fractures, ask your health care provider if you should: Be screened for bone loss. Take a calcium or vitamin D supplement to lower your risk of fractures. Be given hormone replacement therapy (HRT) to treat symptoms of menopause. Follow these instructions at home: Alcohol use Do not drink alcohol if: Your health care provider tells you not to drink. You are pregnant, may be pregnant, or are planning to become pregnant. If you drink alcohol: Limit how much you have to: 0-1 drink a day. Know how much alcohol is in your drink. In the [place] one drink  equals one 12 oz bottle of beer (355 mL), one 5 oz glass of wine (148 mL), or one 1 oz glass of hard liquor (44 mL). Lifestyle Do not use any products that contain nicotine or tobacco. These products include cigarettes, chewing tobacco, and vaping devices, such as e-cigarettes. If you need help quitting, ask your health care provider. Do not use street drugs. Do not share needles. Ask your health care provider for help if you need support or information about quitting drugs. General instructions Schedule regular health, dental, and eye exams. Stay current with your vaccines. Tell your health care provider if: You often feel depressed. You have ever been abused or do not feel safe at home. Summary Adopting a healthy lifestyle and getting preventive care are important in promoting health and wellness. Follow your health care provider's instructions about healthy diet, exercising, and getting tested or screened for diseases. Follow your health care provider's instructions on monitoring your cholesterol and blood pressure. This information is not intended to replace advice given to you by your health care provider. Make sure you discuss any questions you have with your health care provider. Document Revised: 10/02/2020 Document Reviewed: 10/02/2020 Elsevier Patient Education  2024  [employer].

## 2023-08-18 NOTE — Assessment & Plan Note (Addendum)
 Problem has been adequately controlled. Continue levothyroxine 88 mcg daily. Further recommendation will be given according to TSH result.

## 2023-08-18 NOTE — Assessment & Plan Note (Signed)
 Currently she is not on iron supplementation. Further recommendation will be given according to CBC and iron studies.

## 2023-08-18 NOTE — Progress Notes (Signed)
 HPI: Colleen Henderson is a 43 y.o. female with a PMHx significant for appendicitis, hypothyroidism, eczematous dermatitis, iron deficiency anemia, and B12 deficiency, who is here today for her routine physical.  Last CPE: 07/25/2021  Exercise: Patient states she started doing cardiovascular exercises daily two weeks ago.  Diet: She is cooking at home and eating vegetables daily.  Sleep: 8-9 hours per night.  Alcohol Use: occasionally 1-2 glasses of wine on weekends Smoking: never Vision: UTD on routine vision care.  Dental: UTD on routine dental care.   Immunization History  Administered Date(s) Administered   Tdap 11/24/2012   Health Maintenance  Topic Date Due   Hepatitis C Screening  Never done   Cervical Cancer Screening (HPV/Pap Cotest)  11/01/2018   DTaP/Tdap/Td (2 - Td or Tdap) 11/25/2022   INFLUENZA VACCINE  12/26/2022   COVID-19 Vaccine (1 - 2024-25 season) Never done   HIV Screening  Completed   HPV VACCINES  Aged Out   Chronic medical problems:   Hypothyroidism:  Currently on levothyroxine 88 mcg daily.  Lab Results  Component Value Date   TSH 2.60 12/17/2016   Iron deficiency anemia:  No longer taking ferrous sulfate.     Component Value Date/Time   IRON 53 08/16/2022 1551   FERRITIN 8 (L) 08/16/2022 1551    Review of Systems  Constitutional:  Negative for activity change, appetite change and fever.  HENT:  Negative for hearing loss, mouth sores, sore throat and trouble swallowing.   Eyes:  Negative for redness and visual disturbance.  Respiratory:  Negative for cough, shortness of breath and wheezing.   Cardiovascular:  Negative for chest pain and leg swelling.  Gastrointestinal:  Negative for abdominal pain, nausea and vomiting.  Endocrine: Negative for cold intolerance, heat intolerance, polydipsia, polyphagia and polyuria.  Genitourinary:  Negative for decreased urine volume, dysuria, hematuria, vaginal bleeding and vaginal discharge.   Musculoskeletal:  Negative for gait problem and myalgias.  Skin:  Negative for color change and rash.  Allergic/Immunologic: Negative for environmental allergies.  Neurological:  Negative for seizures, syncope, weakness and headaches.  Hematological:  Negative for adenopathy. Does not bruise/bleed easily.  Psychiatric/Behavioral:  Negative for confusion. The patient is not nervous/anxious.   All other systems reviewed and are negative.  Current Outpatient Medications on File Prior to Visit  Medication Sig Dispense Refill   ferrous sulfate 325 (65 FE) MG tablet 1 tab every other day. 45 tablet 3   levothyroxine (SYNTHROID) 88 MCG tablet Take 1 tablet (88 mcg total) by mouth daily before breakfast. 90 tablet 1   pantoprazole (PROTONIX) 40 MG tablet Take 40 mg by mouth daily.     UNABLE TO FIND Med Name: Nutrafol 4 tablets daily.     No current facility-administered medications on file prior to visit.   Past Medical History:  Diagnosis Date   Hypothyroidism 09/22/2013   Past Surgical History:  Procedure Laterality Date   APPENDECTOMY  09/03/2017   CESAREAN SECTION     LAPAROSCOPIC APPENDECTOMY N/A 09/03/2017   Procedure: APPENDECTOMY LAPAROSCOPIC;  Surgeon: Axel Filler, MD;  Location: [facility] OR;  Service: General;  Laterality: N/A;   MOUTH SURGERY  04/26/2016   Tooth Implant    No Known Allergies  Family History  Problem Relation Age of Onset   Diabetes Mother    Diabetes Father    Social History   Socioeconomic History   Marital status: Married    Spouse name: Not on file   Number of children: Not  on file   Years of education: Not on file   Highest education level: Bachelor's degree (e.g., BA, AB, BS)  Occupational History   Not on file  Tobacco Use   Smoking status: Never   Smokeless tobacco: Never  Vaping Use   Vaping status: Never Used  Substance and Sexual Activity   Alcohol use: No   Drug use: No   Sexual activity: Not on file  Other Topics Concern   Not  on file  Social History Narrative   Not on file   Social Drivers of Health   Financial Resource Strain: Low Risk  (08/15/2023)   Overall Financial Resource Strain (CARDIA)    Difficulty of Paying Living Expenses: Not hard at all  Food Insecurity: No Food Insecurity (08/15/2023)   Hunger Vital Sign    Worried About Running Out of Food in the Last Year: Never true    Ran Out of Food in the Last Year: Never true  Transportation Needs: No Transportation Needs (08/15/2023)   PRAPARE - Administrator, Civil Service (Medical): No    Lack of Transportation (Non-Medical): No  Physical Activity: Insufficiently Active (08/15/2023)   Exercise Vital Sign    Days of Exercise per Week: 5 days    Minutes of Exercise per Session: 20 min  Stress: Stress Concern Present (08/15/2023)   [employer] of Occupational Health - Occupational Stress Questionnaire    Feeling of Stress : To some extent  Social Connections: Moderately Isolated (08/15/2023)   Social Connection and Isolation Panel [NHANES]    Frequency of Communication with Friends and Family: More than three times a week    Frequency of Social Gatherings with Friends and Family: Once a week    Attends Religious Services: Never    Database administrator or Organizations: No    Attends Engineer, structural: Not on file    Marital Status: Married    Today's Vitals   08/18/23 1254  BP: 118/80  Pulse: 70  Resp: 12  Temp: 98.1 F (36.7 C)  TempSrc: Oral  SpO2: 99%  Weight: 147 lb (66.7 kg)  Height: 5\' 6"  (1.676 m)   Body mass index is 23.73 kg/m.  Wt Readings from Last 3 Encounters:  08/18/23 147 lb (66.7 kg)  08/16/22 145 lb 4 oz (65.9 kg)  09/03/17 134 lb (60.8 kg)   Physical Exam Vitals and nursing note reviewed.  Constitutional:      General: She is not in acute distress.    Appearance: She is well-developed.  HENT:     Head: Normocephalic and atraumatic.     Right Ear: Tympanic membrane, ear canal and  external ear normal.     Left Ear: Tympanic membrane, ear canal and external ear normal.     Mouth/Throat:     Mouth: Mucous membranes are moist.     Pharynx: Oropharynx is clear. Uvula midline.  Eyes:     Extraocular Movements: Extraocular movements intact.     Conjunctiva/sclera: Conjunctivae normal.     Pupils: Pupils are equal, round, and reactive to light.  Neck:     Thyroid: No thyroid mass or thyromegaly (palpable.).  Cardiovascular:     Rate and Rhythm: Normal rate and regular rhythm.     Pulses:          Dorsalis pedis pulses are 2+ on the right side and 2+ on the left side.     Heart sounds: No murmur heard. Pulmonary:  Effort: Pulmonary effort is normal. No respiratory distress.     Breath sounds: Normal breath sounds.  Abdominal:     Palpations: Abdomen is soft. There is no hepatomegaly or mass.     Tenderness: There is no abdominal tenderness.  Genitourinary:    Comments: No concerns today. Musculoskeletal:     Right lower leg: No edema.     Left lower leg: No edema.     Comments: No major deformity or signs of synovitis appreciated.  Lymphadenopathy:     Cervical: No cervical adenopathy.     Upper Body:     Right upper body: No supraclavicular adenopathy.     Left upper body: No supraclavicular adenopathy.  Skin:    General: Skin is warm.     Findings: No erythema or rash.  Neurological:     General: No focal deficit present.     Mental Status: She is alert and oriented to person, place, and time.     Cranial Nerves: No cranial nerve deficit.     Sensory: No sensory deficit.     Motor: No weakness.     Coordination: Coordination normal.     Gait: Gait normal.     Deep Tendon Reflexes:     Reflex Scores:      Bicep reflexes are 2+ on the right side and 2+ on the left side.      Patellar reflexes are 2+ on the right side and 2+ on the left side. Psychiatric:        Mood and Affect: Mood and affect normal.    ASSESSMENT AND PLAN:  Ms. Colleen Henderson  was here today for her annual physical examination.  Lab Results  Component Value Date   WBC 5.6 08/18/2023   HGB 12.3 08/18/2023   HCT 38.0 08/18/2023   MCV 84.5 08/18/2023   PLT 433.0 (H) 08/18/2023   Lab Results  Component Value Date   VITAMINB12 183 (L) 08/18/2023   Lab Results  Component Value Date   CHOL 155 08/18/2023   HDL 66.70 08/18/2023   LDLCALC 76 08/18/2023   TRIG 63.0 08/18/2023   CHOLHDL 2 08/18/2023   Lab Results  Component Value Date   HGBA1C 5.7 08/18/2023   Lab Results  Component Value Date   TSH 0.23 (L) 08/18/2023   Lab Results  Component Value Date   NA 136 08/18/2023   CL 103 08/18/2023   K 4.2 08/18/2023   CO2 25 08/18/2023   BUN 11 08/18/2023   CREATININE 0.69 08/18/2023   GFR 107.34 08/18/2023   CALCIUM 9.8 08/18/2023   ALBUMIN 4.0 09/02/2017   GLUCOSE 84 08/18/2023   Routine general medical examination at a health care facility Assessment & Plan: We discussed the importance of regular physical activity and healthy diet for prevention of chronic illness and/or complications. Preventive guidelines reviewed. Vaccination updated today, Tdap given. She would like to have her next pap smear here in the office, last one in 2023, she will sign a release form to obtain copy. She is going to arrange her mammogram. Next CPE in a year.   B12 deficiency Assessment & Plan: She takes a daily multivitamin. Further recommendation will be given according to B12 result.  Orders: -     Vitamin B12; Future  Iron deficiency anemia, unspecified iron deficiency anemia type Assessment & Plan: Currently she is not on iron supplementation. Further recommendation will be given according to CBC and iron studies.  Orders: -  Iron; Future -     Ferritin; Future -     CBC; Future  Screening for lipoid disorders -     Lipid panel; Future  Screening for endocrine, metabolic and immunity disorder -     Hemoglobin A1c; Future -     Basic  metabolic panel; Future  Need for Tdap vaccination -     Tdap vaccine greater than or equal to 7yo IM  Acquired hypothyroidism Assessment & Plan: Problem has been adequately controlled. Continue levothyroxine 88 mcg daily. Further recommendation will be given according to TSH result.  Orders: -     T4, free; Future -     TSH; Future  Encounter for HCV screening test for low risk patient -     Hepatitis C antibody; Future  Return in 1 year (on 08/17/2024) for CPE and possible pap.   I, Rolla Etienne Wierda, acting as a scribe for Hershal Eriksson [place], MD., have documented all relevant documentation on the behalf of Duane Earnshaw [place], MD, as directed by  Orma Cheetham [place], MD while in the presence of Gary Bultman [place], MD.   I, Jemia Fata [place], MD, have reviewed all documentation for this visit. The documentation on 08/18/23 for the exam, diagnosis, procedures, and orders are all accurate and complete.  Iyad Deroo G. [place], MD  [facility]. [place] office.

## 2023-08-18 NOTE — Assessment & Plan Note (Signed)
 We discussed the importance of regular physical activity and healthy diet for prevention of chronic illness and/or complications. Preventive guidelines reviewed. Vaccination updated today, Tdap given. She would like to have her next pap smear here in the office, last one in 2023, she will sign a release form to obtain copy. She is going to arrange her mammogram. Next CPE in a year.

## 2023-08-18 NOTE — Assessment & Plan Note (Signed)
 She takes a daily multivitamin. Further recommendation will be given according to B12 result.

## 2023-08-19 LAB — HEPATITIS C ANTIBODY: Hepatitis C Ab: NONREACTIVE

## 2023-08-25 ENCOUNTER — Ambulatory Visit

## 2023-08-27 ENCOUNTER — Ambulatory Visit

## 2023-08-29 ENCOUNTER — Other Ambulatory Visit

## 2023-08-29 ENCOUNTER — Ambulatory Visit (INDEPENDENT_AMBULATORY_CARE_PROVIDER_SITE_OTHER)

## 2023-08-29 DIAGNOSIS — E538 Deficiency of other specified B group vitamins: Secondary | ICD-10-CM

## 2023-08-29 MED ORDER — CYANOCOBALAMIN 1000 MCG/ML IJ SOLN
1000.0000 ug | Freq: Once | INTRAMUSCULAR | Status: AC
  Administered 2023-08-29: 1000 ug via INTRAMUSCULAR

## 2023-08-29 NOTE — Progress Notes (Signed)
 Patient is in office today for a nurse visit for B12 Injection. Patient Injection was given in the  Left deltoid. Patient tolerated injection well.

## 2023-09-04 ENCOUNTER — Ambulatory Visit

## 2023-09-04 ENCOUNTER — Ambulatory Visit (INDEPENDENT_AMBULATORY_CARE_PROVIDER_SITE_OTHER)

## 2023-09-04 DIAGNOSIS — E538 Deficiency of other specified B group vitamins: Secondary | ICD-10-CM | POA: Diagnosis not present

## 2023-09-04 MED ORDER — CYANOCOBALAMIN 1000 MCG/ML IJ SOLN
1000.0000 ug | Freq: Once | INTRAMUSCULAR | Status: AC
  Administered 2023-09-04: 1000 ug via INTRAMUSCULAR

## 2023-09-04 NOTE — Progress Notes (Signed)
Per orders of Dr. Jordan, injection of B12 given by Malayia Spizzirri E Leira Regino. Patient tolerated injection well.  

## 2023-09-16 ENCOUNTER — Ambulatory Visit (INDEPENDENT_AMBULATORY_CARE_PROVIDER_SITE_OTHER)

## 2023-09-16 DIAGNOSIS — E538 Deficiency of other specified B group vitamins: Secondary | ICD-10-CM

## 2023-09-16 MED ORDER — CYANOCOBALAMIN 1000 MCG/ML IJ SOLN
1000.0000 ug | Freq: Once | INTRAMUSCULAR | Status: AC
  Administered 2023-09-16: 1000 ug via INTRAMUSCULAR

## 2023-09-16 NOTE — Progress Notes (Signed)
 Patient is in office today for a nurse visit for B12 Injection. Patient Injection was given in the  Left deltoid. Patient tolerated injection well.

## 2023-09-26 ENCOUNTER — Ambulatory Visit

## 2023-09-26 ENCOUNTER — Ambulatory Visit (INDEPENDENT_AMBULATORY_CARE_PROVIDER_SITE_OTHER)

## 2023-09-26 DIAGNOSIS — E538 Deficiency of other specified B group vitamins: Secondary | ICD-10-CM | POA: Diagnosis not present

## 2023-09-26 MED ORDER — CYANOCOBALAMIN 1000 MCG/ML IJ SOLN
1000.0000 ug | Freq: Once | INTRAMUSCULAR | Status: AC
Start: 2023-09-26 — End: 2023-09-26
  Administered 2023-09-26: 1000 ug via INTRAMUSCULAR

## 2023-09-26 NOTE — Progress Notes (Signed)
 Patient is in office today for a nurse visit for B12 Injection. Patient Injection was given in the  Left deltoid. Patient tolerated injection well.

## 2023-10-01 ENCOUNTER — Other Ambulatory Visit: Payer: Self-pay | Admitting: Family Medicine

## 2023-10-01 DIAGNOSIS — Z1231 Encounter for screening mammogram for malignant neoplasm of breast: Secondary | ICD-10-CM

## 2023-10-07 ENCOUNTER — Encounter

## 2023-10-07 DIAGNOSIS — Z1231 Encounter for screening mammogram for malignant neoplasm of breast: Secondary | ICD-10-CM

## 2023-10-07 LAB — HM MAMMOGRAPHY

## 2023-10-08 ENCOUNTER — Encounter: Payer: Self-pay | Admitting: Family Medicine

## 2023-10-08 ENCOUNTER — Telehealth: Payer: Self-pay | Admitting: *Deleted

## 2023-10-08 NOTE — Telephone Encounter (Signed)
 Copied from CRM [id number]. Topic: Clinical - Lab/Test Results >> Oct 08, 2023  3:29 PM Colleen Henderson S wrote: Reason for CRM: Patient states she has received mammogram results and has some questions for provider. Request a callback at [phone]

## 2023-10-09 ENCOUNTER — Other Ambulatory Visit: Payer: Self-pay | Admitting: Family Medicine

## 2023-10-09 ENCOUNTER — Encounter: Payer: Self-pay | Admitting: Family Medicine

## 2023-10-09 MED ORDER — LEVOTHYROXINE SODIUM 88 MCG PO TABS: 88.0000 ug | ORAL_TABLET | Freq: Every day | ORAL | 1 refills | Status: AC

## 2023-10-09 NOTE — Telephone Encounter (Signed)
 See mychart message 10/10/23

## 2023-10-09 NOTE — Telephone Encounter (Signed)
 Copied from CRM [id number]. Topic: Clinical - Medication Refill >> Oct 09, 2023 11:04 AM Colleen Henderson wrote: Medication: levothyroxine  (SYNTHROID ) 88 MCG tablet  Has the patient contacted their pharmacy? Yes (Agent: If no, request that the patient contact the pharmacy for the refill. If patient does not wish to contact the pharmacy document the reason why and proceed with request.) (Agent: If yes, when and what did the pharmacy advise?)  This is the patient's preferred pharmacy:   [facility] [address], [place] - [address] [place] [address] Phone: [phone] Fax: [phone]  Is this the correct pharmacy for this prescription? Yes If no, delete pharmacy and type the correct one.   Has the prescription been filled recently? Yes  Is the patient out of the medication? Yes  Has the patient been seen for an appointment in the last year OR does the patient have an upcoming appointment? Yes  Can we respond through MyChart? No  Agent: Please be advised that Rx refills may take up to 3 business days. We ask that you follow-up with your pharmacy.

## 2023-10-16 ENCOUNTER — Ambulatory Visit: Payer: Self-pay

## 2023-10-16 NOTE — Telephone Encounter (Signed)
 Copied from CRM [id number]. Topic: Clinical - Red Word Triage >> Oct 16, 2023  4:19 PM Howard Macho wrote: Red Word that prompted transfer to Nurse Triage:pain in left shoulder for three or four days and something is going on with her breast on the left side and heaviness   Chief Complaint: left arm pain and lump on breast Symptoms: arm pain and tenderness; lump on breast Frequency: constant Pertinent Negatives: Patient denies falls  Disposition: [] ED /[] Urgent Care (no appt availability in office) / [x] Appointment(In office/virtual)/ []  [facility]/ [] Home Care/ [] Refused Recommended Disposition /[] [place] [facility]/ []  Follow-up with PCP Additional Notes: Patient reports left arm pain from shoulder to hand, reports pain is worse at night and gets better during day. Patient also reports lump on left breast as well. She had a mammogram done on 5/13 and was notified by the radiologist that there appears to be a mass. Pt has a f/u appt with radiology on 5/28. RN recommending patient be seen wthin next 3 days, however no avail appts with PCP within that time, RN offered visit with another provider on 5/23, pt declined, preferring to see PCP so she can discuss the mass on her breast as well. 5/30 appt rescheduled to 5/27, will route message to clinic for f/u and sooner appt if avail .   Reason for Disposition  Breast lump  [1] MODERATE pain (e.g., interferes with normal activities) AND [2] present > 3 days  Answer Assessment - Initial Assessment Questions 1. ONSET: "When did the pain start?"     3-4 days ago  2. LOCATION: "Where is the pain located?"     Left shoulder, arm. And hand, and collar  3. PAIN: "How bad is the pain?" (Scale 1-10; or mild, moderate, severe)   - MILD (1-3): Doesn't interfere with normal activities.   - MODERATE (4-7): Interferes with normal activities (e.g., work or school) or awakens from sleep.   - SEVERE (8-10): Excruciating pain, unable to do any normal  activities, unable to hold a cup of water.     At night pain is moderate to severe, gets better in the morning and through the day  4. WORK OR EXERCISE: "Has there been any recent work or exercise that involved this part of the body?"     Uses the left hand to work  5. CAUSE: "What do you think is causing the arm pain?"     Unsure of cause  6. OTHER SYMPTOMS: "Do you have any other symptoms?" (e.g., neck pain, swelling, rash, fever, numbness, weakness)     Feels chills occasionally, weakness  7. PREGNANCY: "Is there any chance you are pregnant?" "When was your last menstrual period?"     LMP May 14th  Answer Assessment - Initial Assessment Questions 1. SYMPTOM: "What's the main symptom you're concerned about?"  (e.g., lump, pain, rash, nipple discharge)     Lump 2. LOCATION: "Where is the *No Answer* located?"     Left breast, next to nipple  3. ONSET: "When did Lump   start?"     1st noticed last week  4. PRIOR HISTORY: "Do you have any history of prior problems with your breasts?" (e.g., lumps, cancer, fibrocystic breast disease)     No  5. CAUSE: "What do you think is causing this symptom?"     Unsure   6. OTHER SYMPTOMS: "Do you have any other symptoms?" (e.g., fever, breast pain, redness or rash, nipple discharge)     Feels  itchy sometimes  7. PREGNANCY-BREASTFEEDING: "Is there any chance you are pregnant?" "When was your last menstrual period?" "Are you breastfeeding?"     No  Protocols used: Breast Symptoms-A-AH, Arm Pain-A-AH

## 2023-10-21 ENCOUNTER — Encounter: Payer: Self-pay | Admitting: Family Medicine

## 2023-10-21 ENCOUNTER — Ambulatory Visit (INDEPENDENT_AMBULATORY_CARE_PROVIDER_SITE_OTHER): Admitting: Family Medicine

## 2023-10-21 ENCOUNTER — Ambulatory Visit (INDEPENDENT_AMBULATORY_CARE_PROVIDER_SITE_OTHER)

## 2023-10-21 VITALS — BP 120/70 | HR 70 | Resp 12 | Ht 66.0 in | Wt 148.0 lb

## 2023-10-21 DIAGNOSIS — M25512 Pain in left shoulder: Secondary | ICD-10-CM

## 2023-10-21 DIAGNOSIS — N6320 Unspecified lump in the left breast, unspecified quadrant: Secondary | ICD-10-CM | POA: Diagnosis not present

## 2023-10-21 DIAGNOSIS — M25812 Other specified joint disorders, left shoulder: Secondary | ICD-10-CM | POA: Diagnosis not present

## 2023-10-21 MED ORDER — CELECOXIB 100 MG PO CAPS
100.0000 mg | ORAL_CAPSULE | Freq: Two times a day (BID) | ORAL | 0 refills | Status: AC | PRN
Start: 2023-10-21 — End: 2023-11-20

## 2023-10-21 NOTE — Patient Instructions (Addendum)
 A few things to remember from today's visit:  Acute pain of left shoulder - Plan: DG Shoulder Left, celecoxib (CELEBREX) 100 MG capsule  Impingement of left shoulder - Plan: Ambulatory referral to Physical Therapy Celebrex 2 times daily for 10 days then as needed. PT will be arranged. Monitor for new symptoms.  If you need refills for medications you take chronically, please call your pharmacy. Do not use My Chart to request refills or for acute issues that need immediate attention. If you send a my chart message, it may take a few days to be addressed, specially if I am not in the office.  Please be sure medication list is accurate. If a new problem present, please set up appointment sooner than planned today.

## 2023-10-21 NOTE — Progress Notes (Signed)
 ACUTE VISIT Chief Complaint  Patient presents with   Shoulder Pain    Left shoulder   HPI: Ms.Reyanne Vinal is a 43 y.o. female with a PMHx significant for appendicitis, hypothyroidism, eczematous dermatitis, iron deficiency anemia, and B12 deficiency, who is here today complaining of left shoulder pain.   Patient complains of pain all around her left shoulder and in her left clavicle for 9-10 days. She describes the pain as a throbbing pain and rates it as a 7/10 in the morning, decreasing in the afternoon and increasing back to a 7-8/10 at night.  The pain is not worsened by movement and alleviated by rest.  She also endorses a "heaviness" sensation under left clavicle, some swelling/stiffness+IP joint pain in her left hand in the morning. Denies redness or weakness.  Pertinent negatives include neck pain, numbness, tingling, trauma, or unusual activity.   She mentions she has had some recent mild non productive cough which she attributes to allergies.  Negative for SOB or wheezing.  -She also mentions that she felt a mass in her left breast after last mammogram. Tender, she has an appt with Dx mammogram tomorrow. Negative for skin changes or nipple discharge. No Fhx of breast or ovarian cancer. Last mammogram 10/07/23 Bi-Rads 0. Would like to have breast check.  Review of Systems  Constitutional:  Negative for activity change, appetite change and fever.  HENT:  Negative for mouth sores, sore throat and trouble swallowing.   Cardiovascular:  Negative for chest pain, palpitations and leg swelling.  Gastrointestinal:  Negative for abdominal pain and nausea.  Skin:  Negative for rash.  Neurological:  Negative for syncope and headaches.  See other pertinent positives and negatives in HPI.  Current Outpatient Medications on File Prior to Visit  Medication Sig Dispense Refill   levothyroxine  (SYNTHROID ) 88 MCG tablet Take 1 tablet (88 mcg total) by mouth daily before breakfast. 90  tablet 1   UNABLE TO FIND Med Name: Nutrafol 4 tablets daily.     No current facility-administered medications on file prior to visit.   Past Medical History:  Diagnosis Date   Allergy 06/20   Lactose   GERD (gastroesophageal reflux disease)    Hypothyroidism 09/22/2013   No Known Allergies  Social History   Socioeconomic History   Marital status: Married    Spouse name: Not on file   Number of children: Not on file   Years of education: Not on file   Highest education level: Bachelor's degree (e.g., BA, AB, BS)  Occupational History   Not on file  Tobacco Use   Smoking status: Never   Smokeless tobacco: Never  Vaping Use   Vaping status: Never Used  Substance and Sexual Activity   Alcohol use: Yes    Alcohol/week: 2.0 standard drinks of alcohol    Types: 2 Glasses of wine per week   Drug use: No   Sexual activity: Yes    Birth control/protection: Condom  Other Topics Concern   Not on file  Social History Narrative   Not on file   Social Drivers of Health   Financial Resource Strain: Low Risk  (08/15/2023)   Overall Financial Resource Strain (CARDIA)    Difficulty of Paying Living Expenses: Not hard at all  Food Insecurity: No Food Insecurity (08/15/2023)   Hunger Vital Sign    Worried About Running Out of Food in the Last Year: Never true    Ran Out of Food in the Last Year: Never true  Transportation Needs: No Transportation Needs (08/15/2023)   PRAPARE - Administrator, Civil Service (Medical): No    Lack of Transportation (Non-Medical): No  Physical Activity: Insufficiently Active (08/15/2023)   Exercise Vital Sign    Days of Exercise per Week: 5 days    Minutes of Exercise per Session: 20 min  Stress: Stress Concern Present (08/15/2023)   [employer] of Occupational Health - Occupational Stress Questionnaire    Feeling of Stress : To some extent  Social Connections: Moderately Isolated (08/15/2023)   Social Connection and Isolation Panel  [NHANES]    Frequency of Communication with Friends and Family: More than three times a week    Frequency of Social Gatherings with Friends and Family: Once a week    Attends Religious Services: Never    Database administrator or Organizations: No    Attends Banker Meetings: Not on file    Marital Status: Married    Vitals:   10/21/23 1255  BP: 120/70  Pulse: 70  Resp: 12  SpO2: 98%   Body mass index is 23.89 kg/m.  Physical Exam Vitals and nursing note reviewed.  Constitutional:      General: She is not in acute distress.    Appearance: She is well-developed.  HENT:     Head: Normocephalic and atraumatic.  Eyes:     Conjunctiva/sclera: Conjunctivae normal.  Cardiovascular:     Rate and Rhythm: Normal rate and regular rhythm.     Pulses:          Radial pulses are 2+ on the left side.     Heart sounds: No murmur heard. Pulmonary:     Effort: Pulmonary effort is normal. No respiratory distress.     Breath sounds: Normal breath sounds.  Chest:  Breasts:    Right: No mass, nipple discharge or tenderness.     Left: No swelling, nipple discharge or skin change.       Comments:   Musculoskeletal:     Left shoulder: Tenderness (with movement and palpation of anterior aspect) present. No crepitus. Normal range of motion.     Cervical back: No spasms or tenderness. No pain with movement.     Comments: Left shoulder: Zoila Hines' test elicits mild pain, drop arm rotator cuff test negative, empty can supraspinatus test pos, lift-Off Subscapularis test elicits pain.   Lymphadenopathy:     Cervical: No cervical adenopathy.     Upper Body:     Right upper body: No axillary adenopathy.     Left upper body: No axillary adenopathy.  Skin:    General: Skin is warm.     Findings: No erythema or rash.  Neurological:     General: No focal deficit present.     Mental Status: She is alert and oriented to person, place, and time.     Cranial Nerves: No cranial nerve  deficit.     Gait: Gait normal.  Psychiatric:        Mood and Affect: Mood and affect normal.   ASSESSMENT AND PLAN:  Ms. Daponte was seen today for left shoulder pain.   Acute pain of left shoulder We discussed possible etiologies. Celebrex 100 mg bid for 10 d then prn recommended. Further recommendations according to X ray result.  -     DG Shoulder Left; Future -     Celecoxib; Take 1 capsule (100 mg total) by mouth 2 (two) times daily as needed.  Dispense: 40 capsule; Refill:  0  Impingement of left shoulder ? Rotator cuff tendinitis. Mild, no limitation of ROM, so I do not think subacromial steroid injection is needed at this time. Celebrex 100 mg bid and PT recommended.  -     Ambulatory referral to Physical Therapy  Mass of left breast, unspecified quadrant New, we discussed possible etiologies. Mammogram Birads 0. Has appt tomorrow for Dx mammogram.  Return if symptoms worsen or fail to improve, for keep next appointment.  I, Fritz Jewel Wierda, acting as a scribe for Fulton Merry [place], MD., have documented all relevant documentation on the behalf of Shandria Clinch [place], MD, as directed by  Vola Beneke [place], MD while in the presence of Theresia Pree [place], MD.   I, Ananya Mccleese [place], MD, have reviewed all documentation for this visit. The documentation on 10/21/23 for the exam, diagnosis, procedures, and orders are all accurate and complete.  Ammaar Encina G. [place], MD  [facility] [place]. [place] office.

## 2023-10-22 ENCOUNTER — Encounter: Payer: Self-pay | Admitting: Family Medicine

## 2023-10-24 ENCOUNTER — Ambulatory Visit: Admitting: Family Medicine

## 2023-10-27 ENCOUNTER — Ambulatory Visit: Payer: Self-pay | Admitting: Family Medicine

## 2023-10-30 ENCOUNTER — Encounter: Payer: Self-pay | Admitting: Family Medicine

## 2023-10-30 DIAGNOSIS — N6320 Unspecified lump in the left breast, unspecified quadrant: Secondary | ICD-10-CM

## 2023-10-30 NOTE — Telephone Encounter (Signed)
 Patient was contacted by the Breast Health Navigator for post biopsy site check and to discuss any questions or concerns she might have.  The patient stated she did well following the biopsy with no significant bleeding or bruising. She reports no tenderness today in the biopsy area. Post biopsy instructions were reviewed with the patient.  Pathology was discussed and patient was instructed to return to annual, bilateral, screening mammograms.  She was encouraged to contact us  with any further questions or concerns. Patient voiced her understanding. Deanna House, RT(R)(M), [facility] Breast Health Navigator  [facility]

## 2023-12-15 ENCOUNTER — Other Ambulatory Visit: Payer: Self-pay | Admitting: General Surgery

## 2023-12-15 DIAGNOSIS — N6321 Unspecified lump in the left breast, upper outer quadrant: Secondary | ICD-10-CM

## 2023-12-15 DIAGNOSIS — L299 Pruritus, unspecified: Secondary | ICD-10-CM

## 2024-01-23 ENCOUNTER — Other Ambulatory Visit: Payer: Self-pay

## 2024-01-23 ENCOUNTER — Encounter (HOSPITAL_COMMUNITY)
Admission: RE | Admit: 2024-01-23 | Discharge: 2024-01-23 | Disposition: A | Discharge status: Home / Self Care | Source: Ambulatory Visit | Attending: General Surgery | Admitting: General Surgery

## 2024-01-23 ENCOUNTER — Encounter (HOSPITAL_COMMUNITY): Payer: Self-pay

## 2024-01-23 DIAGNOSIS — N6321 Unspecified lump in the left breast, upper outer quadrant: Secondary | ICD-10-CM | POA: Diagnosis not present

## 2024-01-23 DIAGNOSIS — Z01812 Encounter for preprocedural laboratory examination: Secondary | ICD-10-CM | POA: Insufficient documentation

## 2024-01-23 DIAGNOSIS — L299 Pruritus, unspecified: Secondary | ICD-10-CM

## 2024-01-23 LAB — CBC WITH DIFFERENTIAL/PLATELET
Abs Immature Granulocytes: 0.02 K/uL (ref 0.00–0.07)
Basophils Absolute: 0 K/uL (ref 0.0–0.1)
Basophils Relative: 0 %
Eosinophils Absolute: 0.1 K/uL (ref 0.0–0.5)
Eosinophils Relative: 1 %
HCT: 38.2 % (ref 36.0–46.0)
Hemoglobin: 12.3 g/dL (ref 12.0–15.0)
Immature Granulocytes: 0 %
Lymphocytes Relative: 27 %
Lymphs Abs: 2 K/uL (ref 0.7–4.0)
MCH: 28.6 pg (ref 26.0–34.0)
MCHC: 32.2 g/dL (ref 30.0–36.0)
MCV: 88.8 fL (ref 80.0–100.0)
Monocytes Absolute: 0.6 K/uL (ref 0.1–1.0)
Monocytes Relative: 8 %
Neutro Abs: 4.6 K/uL (ref 1.7–7.7)
Neutrophils Relative %: 64 %
Platelets: 419 K/uL — ABNORMAL HIGH (ref 150–400)
RBC: 4.3 MIL/uL (ref 3.87–5.11)
RDW: 13.8 % (ref 11.5–15.5)
WBC: 7.3 K/uL (ref 4.0–10.5)
nRBC: 0 % (ref 0.0–0.2)

## 2024-01-23 LAB — COMPREHENSIVE METABOLIC PANEL WITH GFR
ALT: 15 U/L (ref 0–44)
AST: 18 U/L (ref 15–41)
Albumin: 4.1 g/dL (ref 3.5–5.0)
Alkaline Phosphatase: 72 U/L (ref 38–126)
Anion gap: 11 (ref 5–15)
BUN: 8 mg/dL (ref 6–20)
CO2: 23 mmol/L (ref 22–32)
Calcium: 9 mg/dL (ref 8.9–10.3)
Chloride: 103 mmol/L (ref 98–111)
Creatinine, Ser: 0.71 mg/dL (ref 0.44–1.00)
GFR, Estimated: >60 mL/min (ref >60)
Glucose, Bld: 93 mg/dL (ref 70–99)
Potassium: 4 mmol/L (ref 3.5–5.1)
Sodium: 136 mmol/L (ref 135–145)
Total Bilirubin: 0.3 mg/dL (ref 0.0–1.2)
Total Protein: 6.8 g/dL (ref 6.5–8.1)

## 2024-01-23 NOTE — Patient Instructions (Signed)
 SURGICAL WAITING ROOM VISITATION  Patients having surgery or a procedure may have no more than 2 support people in the waiting area - these visitors may rotate.    Children under the age of 81 must have an adult with them who is not the patient.  Visitors with respiratory illnesses are discouraged from visiting and should remain at home.  If the patient needs to stay at the hospital during part of their recovery, the visitor guidelines for inpatient rooms apply. Pre-op nurse will coordinate an appropriate time for 1 support person to accompany patient in pre-op.  This support person may not rotate.    Please refer to the [facility] website for the visitor guidelines for Inpatients (after your surgery is over and you are in a regular room).       Your procedure is scheduled on: 01-28-24   Report to [facility] Main Entrance    Report to admitting at     10:45 AM   Call this number if you have problems the morning of surgery [phone]   Do not eat food   :After Midnight.. You may have clear liquids until 10:00 am then nothing by mouth    CLEAR LIQUID DIET       Water                                                           Coffee and tea, regular and decaf      NO MILK OR CREAMER Tea                       Plain Jell-O any favor NO RED                                          Fruit ices (not with fruit pulp)                                      Iced Popsicles  NO RED                                                                    apple juice, white grape Sports drinks like Gatorade ( NO RED)              If you have questions, please contact your surgeon's office.   FOLLOW  ANY ADDITIONAL PRE OP INSTRUCTIONS YOU RECEIVED FROM YOUR SURGEON'S OFFICE!!!     Oral Hygiene is also important to reduce your risk of infection.                                    Remember - BRUSH YOUR TEETH THE MORNING OF SURGERY WITH YOUR REGULAR TOOTHPASTE  DENTURES WILL BE REMOVED  PRIOR TO SURGERY PLEASE DO NOT APPLY Poly  grip OR ADHESIVES!!!   Do NOT smoke after Midnight   Stop all vitamins and herbal supplements 7 days before surgery.   Take these medicines the morning of surgery with A SIP OF WATER: levothyroxine     Bring CPAP mask and tubing day of surgery.                              You may not have any metal on your body including hair pins, jewelry, and body piercing             Do not wear make-up, lotions, powders, perfumes/cologne, or deodorant  Do not wear nail polish including gel and S&S, artificial/acrylic nails, or any other type of covering on natural nails including finger and toenails. If you have artificial nails, gel coating, etc. that needs to be removed by a nail salon please have this removed prior to surgery or surgery may need to be canceled/ delayed if the surgeon/ anesthesia feels like they are unable to be safely monitored.   Do not shave  48 hours prior to surgery.    Do not bring valuables to the hospital. [facility] IS NOT             RESPONSIBLE   FOR VALUABLES.   Contacts, glasses, dentures or bridgework may not be worn into surgery.   Bring small overnight bag day of surgery.   DO NOT BRING YOUR HOME MEDICATIONS TO THE HOSPITAL. PHARMACY WILL DISPENSE MEDICATIONS LISTED ON YOUR MEDICATION LIST TO YOU DURING YOUR ADMISSION IN THE HOSPITAL!    Patients discharged on the day of surgery will not be allowed to drive home.  Someone NEEDS to stay with you for the first 24 hours after anesthesia.   Special Instructions: Bring a copy of your healthcare power of attorney and living will documents the day of surgery if you haven't scanned them before.              Please read over the following fact sheets you were given: IF YOU HAVE QUESTIONS ABOUT YOUR PRE-OP INSTRUCTIONS PLEASE CALL [phone].   I If you test positive for Covid or have been in contact with anyone that has tested positive in the last 10 days please notify you  surgeon.    [place] - Preparing for Surgery Before surgery, you can play an important role.  Because skin is not sterile, your skin needs to be as free of germs as possible.  You can reduce the number of germs on your skin by washing with CHG (chlorahexidine gluconate) soap before surgery.  CHG is an antiseptic cleaner which kills germs and bonds with the skin to continue killing germs even after washing. Please DO NOT use if you have an allergy to CHG or antibacterial soaps.  If your skin becomes reddened/irritated stop using the CHG and inform your nurse when you arrive at Short Stay. Do not shave (including legs and underarms) for at least 48 hours prior to the first CHG shower.  You may shave your face/neck. Please follow these instructions carefully:  1.  Shower with CHG Soap the night before surgery and the  morning of Surgery.  2.  If you choose to wash your hair, wash your hair first as usual with your  normal  shampoo.  3.  After you shampoo, rinse your hair and body thoroughly to remove the  shampoo.  4.  Use CHG as you would any other liquid soap.  You can apply chg directly  to the skin and wash                       Gently with a scrungie or clean washcloth.  5.  Apply the CHG Soap to your body ONLY FROM THE NECK DOWN.   Do not use on face/ open                           Wound or open sores. Avoid contact with eyes, ears mouth and genitals (private parts).                       Wash face,  Genitals (private parts) with your normal soap.             6.  Wash thoroughly, paying special attention to the area where your surgery  will be performed.  7.  Thoroughly rinse your body with warm water from the neck down.  8.  DO NOT shower/wash with your normal soap after using and rinsing off  the CHG Soap.                9.  Pat yourself dry with a clean towel.            10.  Wear clean pajamas.            11.  Place clean sheets on your bed the night of your  first shower and do not  sleep with pets. Day of Surgery : Do not apply any lotions/deodorants the morning of surgery.  Please wear clean clothes to the hospital/surgery center.  FAILURE TO FOLLOW THESE INSTRUCTIONS MAY RESULT IN THE CANCELLATION OF YOUR SURGERY PATIENT SIGNATURE_________________________________  NURSE SIGNATURE__________________________________  ________________________________________________________________________

## 2024-01-23 NOTE — Progress Notes (Addendum)
 PCP - Betty [place], MD LOV 10-21-23 epic Cardiologist - no  PPM/ICD -  Device Orders -  Rep Notified -   Chest x-ray -  EKG -  Stress Test -  ECHO -  Cardiac Cath -   Sleep Study -  CPAP -   Fasting Blood Sugar - n/a Checks Blood Sugar __n/a___ times a day  Blood Thinner Instructions: Aspirin Instructions:  ERAS Protcol -yes PRE-SURGERY n/a   COVID vaccine -  Activity--Able to climb a flight of stairs with no CP or SOB Anesthesia review:   Patient denies shortness of breath, fever, cough and chest pain at PAT appointment   All instructions explained to the patient, with a verbal understanding of the material. Patient agrees to go over the instructions while at home for a better understanding. Patient also instructed to self quarantine after being tested for COVID-19. The opportunity to ask questions was provided.

## 2024-01-27 NOTE — H&P (Signed)
 REFERRING PHYSICIAN:  Mir     PROVIDER:  JINA CLAIR NEPHEW, MD   Care Team: Patient Care Team: [place], Betty G, MD as PCP - General (Family Medicine)    MRN: [id number] DOB: 07-Sep-1980 DATE OF ENCOUNTER: 12/15/2023   Subjective    Chief Complaint: New Consultation (Mass of left breast on mammogram)       History of Present Illness: Colleen Henderson is a 43 y.o. female who is seen today as an office consultation at the request of Dr. Arch for evaluation of New Consultation (Mass of left breast on mammogram) .     Patient presents with a breast mass that has been present now for several months.  She noticed that this spring and diagnostic imaging was performed.  A centimeter mass was noted on diagnostic imaging.  Core needle biopsy was performed and fibroepithelial lesion was found.  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  Patient does think that this has been getting larger.  It is sore now.  Of note, the patient also developed itching on her upper chest following the biopsy.  The patient has no personal history of any malignancy.  The patient does have a family history of breast cancer in her paternal aunt who was diagnosed with breast cancer in her 81s.  She is unaware of any other malignancies.       Family cancer history - paternal aunt had breast cancer in 40s.  Parity -P2   Work - homemaker   Diagnostic mammogram/us  :[facility] 10/22/23 FINDINGS:  Mammographically, spot compression views confirm the presence of an oval equal density mass with circumscribed margins in the upper central aspect of the left breast, middle depth.  On physical examination, no discrete palpable mass. No suspicious skin changes.  On targeted ultrasound, at 12:30, 3 cm from the nipple there is an oval hypoechoic mass with partially angulated and microlobulated margins corresponding to the mammographic finding  Survey of the left axilla demonstrates morphologically normal lymph nodes with preserved fatty hila.   IMPRESSION:  CONCLUSION:  Suspicious left breast mass for which ultrasound-guided biopsy is recommended.  Breast composition: The breasts are heterogeneously dense, which may obscure small masses.  BI-RADS Category: 4 - Suspicious abnormality. Biopsy should be considered.  RECOMMENDATION: Biopsy    Pathology core needle biopsy: 10/29/2023 BREAST, LEFT, NEEDLE CORE BIOPSY:              Fibroepithelial lesion, favor fibroadenoma.               Negative for malignancy.      Review of Systems: A complete review of systems was obtained from the patient.  I have reviewed this information and discussed as appropriate with the patient.  See HPI as well for other ROS. ROS - positive for itching upper chest       Medical History: Past Medical History  History reviewed. No pertinent past medical history.     Problem List     Patient Active Problem List  Diagnosis   Mass of upper outer quadrant of left breast   Rash due to allergy        Past Surgical History       Past Surgical History:  Procedure Laterality Date   APPENDECTOMY   2019        Allergies  No Known Allergies     Medications Ordered Prior to Encounter        Current Outpatient Medications on File Prior to Visit  Medication Sig Dispense Refill  levothyroxine  (SYNTHROID ) 88 MCG tablet Take 88 mcg by mouth once daily        No current facility-administered medications on file prior to visit.        Family History       Family History  Problem Relation Age of Onset   Diabetes Mother     Diabetes Father     High blood pressure (Hypertension) Father          Tobacco Use History  Social History       Tobacco Use  Smoking Status Never  Smokeless Tobacco Never        Social History  Social History         Socioeconomic History   Marital status: Married  Tobacco Use   Smoking status: Never   Smokeless tobacco: Never  Vaping Use   Vaping status: Never Used  Substance and Sexual Activity    Alcohol use: Yes      Alcohol/week: 0.0 - 1.0 standard drinks of alcohol   Drug use: Never   Sexual activity: Defer    Social Drivers of Health        Financial Resource Strain: Low Risk  (08/15/2023)    Received from [facility]    Overall Financial Resource Strain (CARDIA)     Difficulty of Paying Living Expenses: Not hard at all  Food Insecurity: No Food Insecurity (08/15/2023)    Received from [facility]    Hunger Vital Sign     Within the past 12 months, you worried that your food would run out before you got the money to buy more.: Never true     Within the past 12 months, the food you bought just didn't last and you didn't have money to get more.: Never true  Transportation Needs: No Transportation Needs (08/15/2023)    Received from [facility] - Transportation     Lack of Transportation (Medical): No     Lack of Transportation (Non-Medical): No  Physical Activity: Insufficiently Active (08/15/2023)    Received from [facility]    Exercise Vital Sign     On average, how many days per week do you engage in moderate to strenuous exercise (like a brisk walk)?: 5 days     On average, how many minutes do you engage in exercise at this level?: 20 min  Stress: Stress Concern Present (08/15/2023)    Received from [facility] - Occupational Stress Questionnaire     Feeling of Stress : To some extent  Social Connections: Moderately Isolated (08/15/2023)    Received from [facility]    Social Connection and Isolation Panel     In a typical week, how many times do you talk on the phone with family, friends, or neighbors?: More than three times a week     How often do you get together with friends or relatives?: Once a week     How often do you attend church or religious services?: Never     Do you belong to any clubs or organizations such as church groups, unions, fraternal or athletic groups, or school groups?: No     Are you  married, widowed, divorced, separated, never married, or living with a partner?: Married  Housing Stability: Unknown (12/15/2023)    Housing Stability Vital Sign     Homeless in the Last Year: No        Objective:  Vitals:    12/15/23 1207 12/15/23 1210  BP: 119/82    Pulse: 78    Temp: 36.9 C (98.4 F)    TempSrc: Temporal    SpO2: 99%    Weight: 66.3 kg (146 lb 3.2 oz)    Height: 167.6 cm (5' 6)    PainSc:   0-No pain    Body mass index is 23.6 kg/m.     Gen:  No acute distress.  Well nourished and well groomed.   Neurological: Alert and oriented to person, place, and time. Coordination normal.  Head: Normocephalic and atraumatic.  Eyes: Conjunctivae are normal. Pupils are equal, round, and reactive to light. No scleral icterus.  Neck: Normal range of motion. Neck supple. No tracheal deviation or thyromegaly present.  Cardiovascular: Normal rate, regular rhythm, normal heart sounds and intact distal pulses.  Exam reveals no gallop and no friction rub.  No murmur heard. Breast: Patient has a palpable mass between 12 and 1:00 just above the areolar border that is more prominent when she is supine.  It is tender and there is bruising at the biopsy site.  This is mobile.  Does feel lobulated.  It is rubbery.  No lymphadenopathy is present.  There is no nipple retraction or deviation.  No nipple discharge is present.  Right breast is benign.  There is a macular rash with excoriation over the upper breast bilaterally. Respiratory: Effort normal.  No respiratory distress. No chest wall tenderness. Breath sounds normal.  No wheezes, rales or rhonchi.  GI: Soft. Bowel sounds are normal. The abdomen is soft and nontender.  There is no rebound and no guarding.  Musculoskeletal: Normal range of motion. Extremities are nontender.  Lymphadenopathy: No cervical, preauricular, postauricular or axillary adenopathy is present Skin: Skin is warm and dry. No rash noted. No diaphoresis. No  erythema. No pallor. No clubbing, cyanosis, or edema.   Psychiatric: Normal mood and affect. Behavior is normal. Judgment and thought content normal.      Labs None are available   Assessment and Plan:        ICD-10-CM    1. Rash due to allergy  T78.40XA betamethasone dipropionate (DIPROSONE) 0.05 % cream    R21       2. Mass of upper outer quadrant of left breast  N63.21         Patient has a fibroepithelial lesion in the upper left breast that is most likely a fibroadenoma.  However the mass is enlarging, tender and certainly could be a phyllodes tumor.  Will plan to excise the mass.     The surgical procedure was described to the patient and spouse.  I discussed the incision type and location and that we would need radiology involved pre op to place a seed 1-2 days pre op.      We discussed the risks bleeding, infection, damage to other structures, need for further procedures/surgeries.  We discussed the risk of seroma.  The patient was advised if the breast has cancer, we may need to go back to surgery for additional tissue to obtain negative margins.  The patient was advised that these are the most common complications, but that others can occur as well. I discussed the risk of alteration in breast contour or size.  I discussed risk of chronic pain.  There are rare instances of heart/lung issues post op as well as blood clots.      They were advised against taking aspirin or other anti-inflammatory agents/blood  thinners the week before surgery.     The risks and benefits of the procedure were described to the patient and she wishes to proceed.

## 2024-01-27 NOTE — H&P (View-Only) (Signed)
 REFERRING PHYSICIAN:  Mir     PROVIDER:  JINA CLAIR NEPHEW, MD   Care Team: Patient Care Team: [place], Betty G, MD as PCP - General (Family Medicine)    MRN: [id number] DOB: 07-Sep-1980 DATE OF ENCOUNTER: 12/15/2023   Subjective    Chief Complaint: New Consultation (Mass of left breast on mammogram)       History of Present Illness: Colleen Henderson is a 43 y.o. female who is seen today as an office consultation at the request of Dr. Arch for evaluation of New Consultation (Mass of left breast on mammogram) .     Patient presents with a breast mass that has been present now for several months.  She noticed that this spring and diagnostic imaging was performed.  A centimeter mass was noted on diagnostic imaging.  Core needle biopsy was performed and fibroepithelial lesion was found.  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  Patient does think that this has been getting larger.  It is sore now.  Of note, the patient also developed itching on her upper chest following the biopsy.  The patient has no personal history of any malignancy.  The patient does have a family history of breast cancer in her paternal aunt who was diagnosed with breast cancer in her 81s.  She is unaware of any other malignancies.       Family cancer history - paternal aunt had breast cancer in 40s.  Parity -P2   Work - homemaker   Diagnostic mammogram/us  :[facility] 10/22/23 FINDINGS:  Mammographically, spot compression views confirm the presence of an oval equal density mass with circumscribed margins in the upper central aspect of the left breast, middle depth.  On physical examination, no discrete palpable mass. No suspicious skin changes.  On targeted ultrasound, at 12:30, 3 cm from the nipple there is an oval hypoechoic mass with partially angulated and microlobulated margins corresponding to the mammographic finding  Survey of the left axilla demonstrates morphologically normal lymph nodes with preserved fatty hila.   IMPRESSION:  CONCLUSION:  Suspicious left breast mass for which ultrasound-guided biopsy is recommended.  Breast composition: The breasts are heterogeneously dense, which may obscure small masses.  BI-RADS Category: 4 - Suspicious abnormality. Biopsy should be considered.  RECOMMENDATION: Biopsy    Pathology core needle biopsy: 10/29/2023 BREAST, LEFT, NEEDLE CORE BIOPSY:              Fibroepithelial lesion, favor fibroadenoma.               Negative for malignancy.      Review of Systems: A complete review of systems was obtained from the patient.  I have reviewed this information and discussed as appropriate with the patient.  See HPI as well for other ROS. ROS - positive for itching upper chest       Medical History: Past Medical History  History reviewed. No pertinent past medical history.     Problem List     Patient Active Problem List  Diagnosis   Mass of upper outer quadrant of left breast   Rash due to allergy        Past Surgical History       Past Surgical History:  Procedure Laterality Date   APPENDECTOMY   2019        Allergies  No Known Allergies     Medications Ordered Prior to Encounter        Current Outpatient Medications on File Prior to Visit  Medication Sig Dispense Refill  levothyroxine  (SYNTHROID ) 88 MCG tablet Take 88 mcg by mouth once daily        No current facility-administered medications on file prior to visit.        Family History       Family History  Problem Relation Age of Onset   Diabetes Mother     Diabetes Father     High blood pressure (Hypertension) Father          Tobacco Use History  Social History       Tobacco Use  Smoking Status Never  Smokeless Tobacco Never        Social History  Social History         Socioeconomic History   Marital status: Married  Tobacco Use   Smoking status: Never   Smokeless tobacco: Never  Vaping Use   Vaping status: Never Used  Substance and Sexual Activity    Alcohol use: Yes      Alcohol/week: 0.0 - 1.0 standard drinks of alcohol   Drug use: Never   Sexual activity: Defer    Social Drivers of Health        Financial Resource Strain: Low Risk  (08/15/2023)    Received from [facility]    Overall Financial Resource Strain (CARDIA)     Difficulty of Paying Living Expenses: Not hard at all  Food Insecurity: No Food Insecurity (08/15/2023)    Received from [facility]    Hunger Vital Sign     Within the past 12 months, you worried that your food would run out before you got the money to buy more.: Never true     Within the past 12 months, the food you bought just didn't last and you didn't have money to get more.: Never true  Transportation Needs: No Transportation Needs (08/15/2023)    Received from [facility] - Transportation     Lack of Transportation (Medical): No     Lack of Transportation (Non-Medical): No  Physical Activity: Insufficiently Active (08/15/2023)    Received from [facility]    Exercise Vital Sign     On average, how many days per week do you engage in moderate to strenuous exercise (like a brisk walk)?: 5 days     On average, how many minutes do you engage in exercise at this level?: 20 min  Stress: Stress Concern Present (08/15/2023)    Received from [facility] - Occupational Stress Questionnaire     Feeling of Stress : To some extent  Social Connections: Moderately Isolated (08/15/2023)    Received from [facility]    Social Connection and Isolation Panel     In a typical week, how many times do you talk on the phone with family, friends, or neighbors?: More than three times a week     How often do you get together with friends or relatives?: Once a week     How often do you attend church or religious services?: Never     Do you belong to any clubs or organizations such as church groups, unions, fraternal or athletic groups, or school groups?: No     Are you  married, widowed, divorced, separated, never married, or living with a partner?: Married  Housing Stability: Unknown (12/15/2023)    Housing Stability Vital Sign     Homeless in the Last Year: No        Objective:  Vitals:    12/15/23 1207 12/15/23 1210  BP: 119/82    Pulse: 78    Temp: 36.9 C (98.4 F)    TempSrc: Temporal    SpO2: 99%    Weight: 66.3 kg (146 lb 3.2 oz)    Height: 167.6 cm (5' 6)    PainSc:   0-No pain    Body mass index is 23.6 kg/m.     Gen:  No acute distress.  Well nourished and well groomed.   Neurological: Alert and oriented to person, place, and time. Coordination normal.  Head: Normocephalic and atraumatic.  Eyes: Conjunctivae are normal. Pupils are equal, round, and reactive to light. No scleral icterus.  Neck: Normal range of motion. Neck supple. No tracheal deviation or thyromegaly present.  Cardiovascular: Normal rate, regular rhythm, normal heart sounds and intact distal pulses.  Exam reveals no gallop and no friction rub.  No murmur heard. Breast: Patient has a palpable mass between 12 and 1:00 just above the areolar border that is more prominent when she is supine.  It is tender and there is bruising at the biopsy site.  This is mobile.  Does feel lobulated.  It is rubbery.  No lymphadenopathy is present.  There is no nipple retraction or deviation.  No nipple discharge is present.  Right breast is benign.  There is a macular rash with excoriation over the upper breast bilaterally. Respiratory: Effort normal.  No respiratory distress. No chest wall tenderness. Breath sounds normal.  No wheezes, rales or rhonchi.  GI: Soft. Bowel sounds are normal. The abdomen is soft and nontender.  There is no rebound and no guarding.  Musculoskeletal: Normal range of motion. Extremities are nontender.  Lymphadenopathy: No cervical, preauricular, postauricular or axillary adenopathy is present Skin: Skin is warm and dry. No rash noted. No diaphoresis. No  erythema. No pallor. No clubbing, cyanosis, or edema.   Psychiatric: Normal mood and affect. Behavior is normal. Judgment and thought content normal.      Labs None are available   Assessment and Plan:        ICD-10-CM    1. Rash due to allergy  T78.40XA betamethasone dipropionate (DIPROSONE) 0.05 % cream    R21       2. Mass of upper outer quadrant of left breast  N63.21         Patient has a fibroepithelial lesion in the upper left breast that is most likely a fibroadenoma.  However the mass is enlarging, tender and certainly could be a phyllodes tumor.  Will plan to excise the mass.     The surgical procedure was described to the patient and spouse.  I discussed the incision type and location and that we would need radiology involved pre op to place a seed 1-2 days pre op.      We discussed the risks bleeding, infection, damage to other structures, need for further procedures/surgeries.  We discussed the risk of seroma.  The patient was advised if the breast has cancer, we may need to go back to surgery for additional tissue to obtain negative margins.  The patient was advised that these are the most common complications, but that others can occur as well. I discussed the risk of alteration in breast contour or size.  I discussed risk of chronic pain.  There are rare instances of heart/lung issues post op as well as blood clots.      They were advised against taking aspirin or other anti-inflammatory agents/blood  thinners the week before surgery.     The risks and benefits of the procedure were described to the patient and she wishes to proceed.

## 2024-01-28 ENCOUNTER — Ambulatory Visit (HOSPITAL_COMMUNITY)
Admission: RE | Admit: 2024-01-28 | Discharge: 2024-01-28 | Disposition: A | Discharge status: Home / Self Care | Attending: General Surgery | Admitting: General Surgery

## 2024-01-28 ENCOUNTER — Other Ambulatory Visit: Payer: Self-pay

## 2024-01-28 ENCOUNTER — Encounter (HOSPITAL_COMMUNITY): Payer: Self-pay | Admitting: General Surgery

## 2024-01-28 ENCOUNTER — Ambulatory Visit (HOSPITAL_BASED_OUTPATIENT_CLINIC_OR_DEPARTMENT_OTHER): Admitting: Certified Registered"

## 2024-01-28 ENCOUNTER — Ambulatory Visit (HOSPITAL_COMMUNITY): Admitting: Certified Registered"

## 2024-01-28 ENCOUNTER — Encounter (HOSPITAL_COMMUNITY)
Admission: RE | Disposition: A | Discharge status: Home / Self Care | Payer: Self-pay | Source: Home / Self Care | Attending: General Surgery

## 2024-01-28 DIAGNOSIS — D242 Benign neoplasm of left breast: Secondary | ICD-10-CM | POA: Insufficient documentation

## 2024-01-28 DIAGNOSIS — K219 Gastro-esophageal reflux disease without esophagitis: Secondary | ICD-10-CM | POA: Diagnosis not present

## 2024-01-28 DIAGNOSIS — E039 Hypothyroidism, unspecified: Secondary | ICD-10-CM | POA: Diagnosis not present

## 2024-01-28 DIAGNOSIS — N6489 Other specified disorders of breast: Secondary | ICD-10-CM | POA: Diagnosis not present

## 2024-01-28 DIAGNOSIS — N6029 Fibroadenosis of unspecified breast: Secondary | ICD-10-CM | POA: Diagnosis not present

## 2024-01-28 DIAGNOSIS — Z803 Family history of malignant neoplasm of breast: Secondary | ICD-10-CM | POA: Insufficient documentation

## 2024-01-28 DIAGNOSIS — N6320 Unspecified lump in the left breast, unspecified quadrant: Secondary | ICD-10-CM | POA: Diagnosis not present

## 2024-01-28 HISTORY — PX: BREAST CYST EXCISION: SHX579

## 2024-01-28 LAB — POCT PREGNANCY, URINE: Preg Test, Ur: NEGATIVE

## 2024-01-28 SURGERY — EXCISION, CYST, BREAST
Anesthesia: General | Site: Breast | Laterality: Left

## 2024-01-28 MED ORDER — PROPOFOL 10 MG/ML IV BOLUS
INTRAVENOUS | Status: DC | PRN
  Administered 2024-01-28: 160 mg via INTRAVENOUS
  Administered 2024-01-28: 20 mg via INTRAVENOUS

## 2024-01-28 MED ORDER — ACETAMINOPHEN 500 MG PO TABS
1000.0000 mg | ORAL_TABLET | ORAL | Status: AC
  Administered 2024-01-28: 1000 mg via ORAL
  Filled 2024-01-28: qty 2

## 2024-01-28 MED ORDER — STERILE WATER FOR IRRIGATION IR SOLN
Status: DC | PRN
  Administered 2024-01-28: 1000 mL

## 2024-01-28 MED ORDER — DROPERIDOL 2.5 MG/ML IJ SOLN: 0.6250 mg | Freq: Once | INTRAMUSCULAR | Status: DC | PRN

## 2024-01-28 MED ORDER — ONDANSETRON HCL 4 MG/2ML IJ SOLN
INTRAMUSCULAR | Status: DC | PRN
  Administered 2024-01-28: 4 mg via INTRAVENOUS

## 2024-01-28 MED ORDER — ORAL CARE MOUTH RINSE: 15.0000 mL | Freq: Once | OROMUCOSAL | Status: DC

## 2024-01-28 MED ORDER — PROPOFOL 10 MG/ML IV BOLUS
INTRAVENOUS | Status: AC
  Filled 2024-01-28: qty 20

## 2024-01-28 MED ORDER — OXYCODONE HCL 5 MG PO TABS: 5.0000 mg | ORAL_TABLET | Freq: Once | ORAL | Status: DC | PRN

## 2024-01-28 MED ORDER — CEFAZOLIN SODIUM-DEXTROSE 2-4 GM/100ML-% IV SOLN
2.0000 g | INTRAVENOUS | Status: AC
  Administered 2024-01-28: 2 g via INTRAVENOUS
  Filled 2024-01-28: qty 100

## 2024-01-28 MED ORDER — FENTANYL CITRATE (PF) 100 MCG/2ML IJ SOLN
INTRAMUSCULAR | Status: DC | PRN
  Administered 2024-01-28: 50 ug via INTRAVENOUS
  Administered 2024-01-28 (×2): 25 ug via INTRAVENOUS

## 2024-01-28 MED ORDER — 0.9 % SODIUM CHLORIDE (POUR BTL) OPTIME
TOPICAL | Status: DC | PRN
  Administered 2024-01-28: 1000 mL

## 2024-01-28 MED ORDER — LACTATED RINGERS IV SOLN: INTRAVENOUS | Status: DC

## 2024-01-28 MED ORDER — MIDAZOLAM HCL 2 MG/2ML IJ SOLN
INTRAMUSCULAR | Status: AC
  Filled 2024-01-28: qty 2

## 2024-01-28 MED ORDER — ONDANSETRON HCL 4 MG/2ML IJ SOLN: 4.0000 mg | Freq: Once | INTRAMUSCULAR | Status: DC | PRN

## 2024-01-28 MED ORDER — FENTANYL CITRATE PF 50 MCG/ML IJ SOSY: 25.0000 ug | PREFILLED_SYRINGE | INTRAMUSCULAR | Status: DC | PRN

## 2024-01-28 MED ORDER — CHLORHEXIDINE GLUCONATE CLOTH 2 % EX PADS: 6.0000 | MEDICATED_PAD | Freq: Once | CUTANEOUS | Status: DC

## 2024-01-28 MED ORDER — KETOROLAC TROMETHAMINE 30 MG/ML IJ SOLN
INTRAMUSCULAR | Status: AC
  Filled 2024-01-28: qty 1

## 2024-01-28 MED ORDER — LIDOCAINE HCL (PF) 2 % IJ SOLN
INTRAMUSCULAR | Status: DC | PRN
Start: 2024-01-28 — End: 2024-01-28
  Administered 2024-01-28: 60 mg via INTRADERMAL

## 2024-01-28 MED ORDER — OXYCODONE HCL 5 MG/5ML PO SOLN: 5.0000 mg | Freq: Once | ORAL | Status: DC | PRN

## 2024-01-28 MED ORDER — LIDOCAINE HCL (PF) 1 % IJ SOLN
INTRAMUSCULAR | Status: DC | PRN
  Administered 2024-01-28: 60 mL

## 2024-01-28 MED ORDER — EPHEDRINE SULFATE-NACL 50-0.9 MG/10ML-% IV SOSY
PREFILLED_SYRINGE | INTRAVENOUS | Status: DC | PRN
  Administered 2024-01-28 (×2): 5 mg via INTRAVENOUS

## 2024-01-28 MED ORDER — ONDANSETRON HCL 4 MG/2ML IJ SOLN
INTRAMUSCULAR | Status: AC
  Filled 2024-01-28: qty 2

## 2024-01-28 MED ORDER — DEXAMETHASONE SODIUM PHOSPHATE 10 MG/ML IJ SOLN
INTRAMUSCULAR | Status: DC | PRN
  Administered 2024-01-28: 10 mg via INTRAVENOUS

## 2024-01-28 MED ORDER — BUPIVACAINE-EPINEPHRINE (PF) 0.25% -1:200000 IJ SOLN
INTRAMUSCULAR | Status: AC
  Filled 2024-01-28: qty 30

## 2024-01-28 MED ORDER — FENTANYL CITRATE (PF) 100 MCG/2ML IJ SOLN
INTRAMUSCULAR | Status: AC
  Filled 2024-01-28: qty 2

## 2024-01-28 MED ORDER — KETOROLAC TROMETHAMINE 30 MG/ML IJ SOLN
INTRAMUSCULAR | Status: DC | PRN
  Administered 2024-01-28: 30 mg via INTRAVENOUS

## 2024-01-28 MED ORDER — EPHEDRINE 5 MG/ML INJ
INTRAVENOUS | Status: AC
  Filled 2024-01-28: qty 5

## 2024-01-28 MED ORDER — CHLORHEXIDINE GLUCONATE 0.12 % MT SOLN: 15.0000 mL | Freq: Once | OROMUCOSAL | Status: DC

## 2024-01-28 MED ORDER — LIDOCAINE HCL (PF) 1 % IJ SOLN
INTRAMUSCULAR | Status: AC
  Filled 2024-01-28: qty 30

## 2024-01-28 MED ORDER — HYDROCODONE-ACETAMINOPHEN 5-325 MG PO TABS: 1.0000 | ORAL_TABLET | Freq: Four times a day (QID) | ORAL | 0 refills | Status: AC | PRN

## 2024-01-28 MED ORDER — DEXAMETHASONE SODIUM PHOSPHATE 10 MG/ML IJ SOLN
INTRAMUSCULAR | Status: AC
  Filled 2024-01-28: qty 1

## 2024-01-28 MED ORDER — MIDAZOLAM HCL 5 MG/5ML IJ SOLN
INTRAMUSCULAR | Status: DC | PRN
  Administered 2024-01-28: 2 mg via INTRAVENOUS

## 2024-01-28 SURGICAL SUPPLY — 42 items
BAG COUNTER SPONGE SURGICOUNT (BAG) IMPLANT
BINDER BREAST LRG (GAUZE/BANDAGES/DRESSINGS) IMPLANT
BLADE SURG 15 STRL LF DISP TIS (BLADE) IMPLANT
BLADE SURG SZ10 CARB STEEL (BLADE) ×1 IMPLANT
BNDG COHESIVE 4X5 TAN STRL LF (GAUZE/BANDAGES/DRESSINGS) IMPLANT
BNDG ELASTIC 6INX 5YD STR LF (GAUZE/BANDAGES/DRESSINGS) IMPLANT
BNDG GAUZE DERMACEA FLUFF 4 (GAUZE/BANDAGES/DRESSINGS) IMPLANT
CLIP TI WIDE RED SMALL 6 (CLIP) ×1 IMPLANT
CNTNR URN SCR LID CUP LEK RST (MISCELLANEOUS) ×1 IMPLANT
COVER SURGICAL LIGHT HANDLE (MISCELLANEOUS) ×1 IMPLANT
DERMABOND ADVANCED .7 DNX12 (GAUZE/BANDAGES/DRESSINGS) IMPLANT
DRAIN CHANNEL RND F F (WOUND CARE) IMPLANT
ELECT REM PT RETURN 15FT ADLT (MISCELLANEOUS) ×1 IMPLANT
EVACUATOR SILICONE 100CC (DRAIN) IMPLANT
GAUZE 4X4 16PLY ~~LOC~~+RFID DBL (SPONGE) IMPLANT
GAUZE PAD ABD 8X10 STRL (GAUZE/BANDAGES/DRESSINGS) IMPLANT
GAUZE SPONGE 4X4 12PLY STRL (GAUZE/BANDAGES/DRESSINGS) ×1 IMPLANT
GLOVE BIO SURGEON STRL SZ 6 (GLOVE) ×2 IMPLANT
GLOVE BIOGEL PI IND STRL 6.5 (GLOVE) ×1 IMPLANT
GLOVE BIOGEL PI IND STRL 7.0 (GLOVE) ×1 IMPLANT
GLOVE INDICATOR 6.5 STRL GRN (GLOVE) ×2 IMPLANT
GOWN STRL REUS W/ TWL XL LVL3 (GOWN DISPOSABLE) ×4 IMPLANT
KIT BASIN OR (CUSTOM PROCEDURE TRAY) ×1 IMPLANT
KIT MARKER MARGIN INK (KITS) IMPLANT
KIT TURNOVER KIT A (KITS) ×1 IMPLANT
MARKER SKIN DUAL TIP RULER LAB (MISCELLANEOUS) IMPLANT
NDL HYPO 22X1.5 SAFETY MO (MISCELLANEOUS) IMPLANT
NDL HYPO 25X1 1.5 SAFETY (NEEDLE) IMPLANT
NDL SPNL 22GX3.5 QUINCKE BK (NEEDLE) ×1 IMPLANT
NEEDLE HYPO 22X1.5 SAFETY MO (MISCELLANEOUS) IMPLANT
NEEDLE HYPO 25X1 1.5 SAFETY (NEEDLE) ×1 IMPLANT
NEEDLE SPNL 22GX3.5 QUINCKE BK (NEEDLE) ×1 IMPLANT
NS IRRIG 1000ML POUR BTL (IV SOLUTION) ×1 IMPLANT
PACK GENERAL/GYN (CUSTOM PROCEDURE TRAY) ×1 IMPLANT
PACK UNIVERSAL I (CUSTOM PROCEDURE TRAY) ×1 IMPLANT
SPIKE FLUID TRANSFER (MISCELLANEOUS) ×1 IMPLANT
STAPLER SKIN PROX 35W (STAPLE) ×1 IMPLANT
STOCKINETTE 8 INCH (MISCELLANEOUS) IMPLANT
SUT VIC AB 3-0 SH 27X BRD (SUTURE) IMPLANT
SUT VIC AB 3-0 SH 27XBRD (SUTURE) IMPLANT
SYR CONTROL 10ML LL (SYRINGE) ×1 IMPLANT
TOWEL OR 17X26 10 PK STRL BLUE (TOWEL DISPOSABLE) ×1 IMPLANT

## 2024-01-28 NOTE — Anesthesia Preprocedure Evaluation (Signed)
 Anesthesia Evaluation  Patient identified by MRN, date of birth, ID band Patient awake    Reviewed: Allergy & Precautions, NPO status , Patient's Chart, lab work & pertinent test results  Airway Mallampati: II  TM Distance: >3 FB     Dental  (+) Implants, Dental Advisory Given,    Pulmonary neg pulmonary ROS   Pulmonary exam normal breath sounds clear to auscultation       Cardiovascular negative cardio ROS Normal cardiovascular exam Rhythm:Regular Rate:Normal     Neuro/Psych negative neurological ROS  negative psych ROS   GI/Hepatic ,GERD  ,,  Endo/Other  Hypothyroidism  Left Breast mass  Renal/GU negative Renal ROS  negative genitourinary   Musculoskeletal negative musculoskeletal ROS (+)    Abdominal   Peds  Hematology  (+) Blood dyscrasia, anemia   Anesthesia Other Findings   Reproductive/Obstetrics negative OB ROS                              Anesthesia Physical Anesthesia Plan  ASA: 2  Anesthesia Plan: General   Post-op Pain Management: Minimal or no pain anticipated, Dilaudid  IV, Precedex and Tylenol  PO (pre-op)*   Induction: Intravenous  PONV Risk Score and Plan: 4 or greater and Treatment may vary due to age or medical condition, Midazolam , Propofol  infusion and Ondansetron   Airway Management Planned: LMA  Additional Equipment: None  Intra-op Plan:   Post-operative Plan: Extubation in OR  Informed Consent: I have reviewed the patients History and Physical, chart, labs and discussed the procedure including the risks, benefits and alternatives for the proposed anesthesia with the patient or authorized representative who has indicated his/her understanding and acceptance.     Dental advisory given  Plan Discussed with: CRNA and Anesthesiologist  Anesthesia Plan Comments:          Anesthesia Quick Evaluation

## 2024-01-28 NOTE — Interval H&P Note (Signed)
 History and Physical Interval Note:  01/28/2024 11:53 AM  Colleen Henderson  has presented today for surgery, with the diagnosis of LEFT BREAST MASS.  The various methods of treatment have been discussed with the patient and family. After consideration of risks, benefits and other options for treatment, the patient has consented to  Procedure(s) with comments: EXCISION, CYST, BREAST (Left) - EXCISION OF LEFT BREAST MASS as a surgical intervention.  The patient's history has been reviewed, patient examined, no change in status, stable for surgery.  I have reviewed the patient's chart and labs.  Questions were answered to the patient's satisfaction.     Jina Nephew

## 2024-01-28 NOTE — Anesthesia Postprocedure Evaluation (Signed)
 Anesthesia Post Note  Patient: Colleen Henderson  Procedure(s) Performed: EXCISION, CYST, BREAST (Left: Breast)     Patient location during evaluation: PACU Anesthesia Type: General Level of consciousness: awake and alert and oriented Pain management: pain level controlled Vital Signs Assessment: post-procedure vital signs reviewed and stable Respiratory status: spontaneous breathing, nonlabored ventilation and respiratory function stable Cardiovascular status: blood pressure returned to baseline and stable Postop Assessment: no apparent nausea or vomiting Anesthetic complications: no   No notable events documented.  Last Vitals:  Vitals:   01/28/24 1408 01/28/24 1417  BP: (!) 131/92 (!) 146/87  Pulse: 66 88  Resp: 15 20  Temp:  (!) 36.3 C  SpO2: 98% 100%    Last Pain:  Vitals:   01/28/24 1417  TempSrc: Oral  PainSc: 0-No pain                 Luzelena Heeg A.

## 2024-01-28 NOTE — Transfer of Care (Signed)
 Immediate Anesthesia Transfer of Care Note  Patient: Colleen Henderson  Procedure(s) Performed: EXCISION, CYST, BREAST (Left: Breast)  Patient Location: PACU  Anesthesia Type:General  Level of Consciousness: awake, alert , and oriented  Airway & Oxygen Therapy: Patient Spontanous Breathing and Patient connected to face mask oxygen  Post-op Assessment: Report given to RN and Post -op Vital signs reviewed and stable  Post vital signs: Reviewed and stable  Last Vitals:  Vitals Value Taken Time  BP 108/96 01/28/24 13:39  Temp    Pulse    Resp 21 01/28/24 13:40  SpO2    Vitals shown include unfiled device data.  Last Pain:  Vitals:   01/28/24 1107  TempSrc: Oral  PainSc:          Complications: No notable events documented.

## 2024-01-28 NOTE — Anesthesia Procedure Notes (Signed)
 Procedure Name: LMA Insertion Date/Time: 01/28/2024 12:15 PM  Performed by: Renleigh Ouellet D, CRNAPre-anesthesia Checklist: Patient identified, Emergency Drugs available, Suction available and Patient being monitored Patient Re-evaluated:Patient Re-evaluated prior to induction Oxygen Delivery Method: Circle system utilized Preoxygenation: Pre-oxygenation with 100% oxygen Induction Type: IV induction Ventilation: Mask ventilation without difficulty LMA: LMA inserted LMA Size: 4.0 Tube type: Oral Number of attempts: 1 Placement Confirmation: positive ETCO2 and breath sounds checked- equal and bilateral Tube secured with: Tape Dental Injury: Teeth and Oropharynx as per pre-operative assessment

## 2024-01-28 NOTE — Discharge Instructions (Addendum)
 [facility] Office Phone Number [phone]  BREAST BIOPSY/ PARTIAL MASTECTOMY: POST OP INSTRUCTIONS  Always review your discharge instruction sheet given to you by the facility where your surgery was performed.  IF YOU HAVE DISABILITY OR FAMILY LEAVE FORMS, YOU MUST BRING THEM TO THE OFFICE FOR PROCESSING.  DO NOT GIVE THEM TO YOUR DOCTOR.  Take 2 tylenol  (acetominophen) three times a day for 3 days.  If you still have pain, add ibuprofen with food in between if able to take this (if you have kidney issues or stomach issues, do not take ibuprofen).  If both of those are not enough, add the narcotic pain pill.  If you find you are needing a lot of this overnight after surgery, call the next morning for a refill.    Prescriptions will not be filled after 5pm or on week-ends. Take your usually prescribed medications unless otherwise directed You should eat very light the first 24 hours after surgery, such as soup, crackers, pudding, etc.  Resume your normal diet the day after surgery. Most patients will experience some swelling and bruising in the breast.  Ice packs and a good support bra will help.  Swelling and bruising can take several days to resolve.  It is common to experience some constipation if taking pain medication after surgery.  Increasing fluid intake and taking a stool softener will usually help or prevent this problem from occurring.  A mild laxative (Milk of Magnesia or Miralax ) should be taken according to package directions if there are no bowel movements after 48 hours. Unless discharge instructions indicate otherwise, you may remove your bandages 48 hours after surgery, and you may shower at that time.  You may have steri-strips (small skin tapes) in place directly over the incision.  These strips should be left on the skin at least for for 7-10 days.    ACTIVITIES:  You may resume regular daily activities (gradually increasing) beginning the next day.  Wearing a  good support bra or sports bra (or the breast binder) minimizes pain and swelling.  You may have sexual intercourse when it is comfortable. No heavy lifting for 1-2 weeks (not over around 10 pounds).  You may drive when you no longer are taking prescription pain medication, you can comfortably wear a seatbelt, and you can safely maneuver your car and apply brakes. RETURN TO WORK:  __________3-14 days depending on job. _______________ Colleen Henderson should see your doctor in the office for a follow-up appointment approximately two weeks after your surgery.  Your doctor's nurse will typically make your follow-up appointment when she calls you with your pathology report.  Expect your pathology report 3-4 business days after your surgery.  You may call to check if you do not hear from us  after three days.   WHEN TO CALL YOUR DOCTOR: Fever over 101.0 Nausea and/or vomiting. Extreme swelling or bruising. Continued bleeding from incision. Increased pain, redness, or drainage from the incision.  The clinic staff is available to answer your questions during regular business hours.  Please don't hesitate to call and ask to speak to one of the nurses for clinical concerns.  If you have a medical emergency, go to the nearest emergency room or call 911.  A surgeon from [facility] is always on call at the hospital.  For further questions, please visit [email or web address]

## 2024-01-28 NOTE — Op Note (Signed)
 Excisional Breast Biopsy  Indications: This patient presents with history of left breast mass  Pre-operative Diagnosis: left breast mass  Post-operative Diagnosis: left breast mass  Surgeon: ARON SHOULDERS   Anesthesia: General LMA anesthesia  ASA Class: 2  Procedure Details  The patient was seen in the Holding Room. The risks, benefits, complications, treatment options, and expected outcomes were discussed with the patient. The possibilities of reaction to medication, pulmonary aspiration, bleeding, infection, the need for additional procedures, failure to diagnose a condition, and creating a complication requiring transfusion or operation were discussed with the patient. The patient concurred with the proposed plan, giving informed consent.  The site of surgery properly noted/marked. This was confirmed with the patient preoperatively prior to any anesthetic.    The patient was taken to Operating Room # 1, identified, and the procedure verified as Breast Excisional Biopsy. A Time Out was held and the above information confirmed.  After induction of anesthesia, the left  breast and chest were prepped and draped in standard fashion. The lumpectomy was performed by creating a superior circumareolar incision near the mass.    Dissection was carried down around the mass.  The breast tissue was marked with the margin marker paint kit. Hemostasis was achieved with cautery.    The tissue adjacent to the defect was mobilized to minimize the defect and pulled together with 3-0 vicryl interrupted suture.  Local anesthetic was infiltrated into the surrounding tissue  The wound was irrigated and closed with a 3-0 Vicryl interrupted deep dermal stitch and a 4-0 Monocryl subcuticular closure in layers.    Sterile dressings were applied. At the end of the operation, all sponge, instrument, and needle counts were correct.  Findings: grossly clear surgical margins  Estimated Blood Loss:  Minimal                 Specimens: left breast mass to pathology         Complications:  None; patient tolerated the procedure well.         Disposition: PACU - hemodynamically stable.         Condition: stable

## 2024-01-29 ENCOUNTER — Encounter (HOSPITAL_COMMUNITY): Payer: Self-pay | Admitting: General Surgery

## 2024-01-30 LAB — SURGICAL PATHOLOGY

## 2024-02-02 ENCOUNTER — Other Ambulatory Visit: Payer: Self-pay | Admitting: General Surgery

## 2024-02-02 ENCOUNTER — Ambulatory Visit: Payer: Self-pay | Admitting: General Surgery

## 2024-02-02 NOTE — Telephone Encounter (Signed)
 Discussed path.  Will plan reexcision.  Orders written, posting slip submitted

## 2024-02-10 ENCOUNTER — Other Ambulatory Visit: Payer: Self-pay

## 2024-02-10 ENCOUNTER — Encounter (HOSPITAL_COMMUNITY): Payer: Self-pay | Admitting: General Surgery

## 2024-02-10 NOTE — Progress Notes (Signed)
 SDW call  Patient was given pre-op instructions over the phone. Patient verbalized understanding of instructions provided.     PCP - Dr. Betty [place] Cardiologist -  Pulmonary:    PPM/ICD - denies Device Orders - na Rep Notified - na   Chest x-ray - na EKG -  na Stress Test - ECHO -  Cardiac Cath -   Sleep Study/sleep apnea/CPAP: denies  Non-diabetic  Blood Thinner Instructions: denies Aspirin Instructions:denies   ERAS Protcol - Clears until 0730   Anesthesia review: No   Patient denies shortness of breath, fever, cough and chest pain over the phone call  Your procedure is scheduled on Thursday February 12, 2024  Report to [facility] Main Entrance A at  0800  A.M., then check in with the Admitting office.  Call this number if you have problems the morning of surgery:  [phone]   If you have any questions prior to your surgery date call [phone]: Open Monday-Friday 8am-4pm If you experience any cold or flu symptoms such as cough, fever, chills, shortness of breath, etc. between now and your scheduled surgery, please notify us  at the above number    Remember:  Do not eat after midnight the night before your surgery  You may drink clear liquids until  0730   the morning of your surgery.   Clear liquids allowed are: Water , Non-Citrus Juices (without pulp), Carbonated Beverages, Clear Tea, Black Coffee ONLY (NO MILK, CREAM OR POWDERED CREAMER of any kind), and Gatorade   Take these medicines the morning of surgery with A SIP OF WATER :  Levothyroxine   As needed: Tylenol , norco, protonix  As of today, STOP taking any Aspirin (unless otherwise instructed by your surgeon) Aleve, Naproxen, Ibuprofen , Motrin , Advil , Goody's, BC's, all herbal medications, fish oil, and all vitamins.

## 2024-02-12 ENCOUNTER — Encounter (HOSPITAL_COMMUNITY): Payer: Self-pay | Admitting: General Surgery

## 2024-02-12 ENCOUNTER — Ambulatory Visit (HOSPITAL_COMMUNITY)
Admission: RE | Admit: 2024-02-12 | Discharge: 2024-02-12 | Disposition: A | Discharge status: Home / Self Care | Attending: General Surgery | Admitting: General Surgery

## 2024-02-12 ENCOUNTER — Ambulatory Visit (HOSPITAL_BASED_OUTPATIENT_CLINIC_OR_DEPARTMENT_OTHER): Admitting: Anesthesiology

## 2024-02-12 ENCOUNTER — Encounter (HOSPITAL_COMMUNITY)
Admission: RE | Disposition: A | Discharge status: Home / Self Care | Payer: Self-pay | Source: Home / Self Care | Attending: General Surgery

## 2024-02-12 ENCOUNTER — Other Ambulatory Visit: Payer: Self-pay

## 2024-02-12 ENCOUNTER — Ambulatory Visit (HOSPITAL_COMMUNITY): Admitting: Anesthesiology

## 2024-02-12 DIAGNOSIS — D4862 Neoplasm of uncertain behavior of left breast: Secondary | ICD-10-CM | POA: Diagnosis present

## 2024-02-12 DIAGNOSIS — N6321 Unspecified lump in the left breast, upper outer quadrant: Secondary | ICD-10-CM | POA: Insufficient documentation

## 2024-02-12 DIAGNOSIS — Z79899 Other long term (current) drug therapy: Secondary | ICD-10-CM | POA: Diagnosis not present

## 2024-02-12 DIAGNOSIS — D493 Neoplasm of unspecified behavior of breast: Secondary | ICD-10-CM

## 2024-02-12 DIAGNOSIS — K219 Gastro-esophageal reflux disease without esophagitis: Secondary | ICD-10-CM | POA: Diagnosis not present

## 2024-02-12 HISTORY — PX: BREAST LUMPECTOMY: SHX2

## 2024-02-12 LAB — POCT PREGNANCY, URINE: Preg Test, Ur: NEGATIVE

## 2024-02-12 SURGERY — BREAST LUMPECTOMY
Anesthesia: General | Site: Breast | Laterality: Left

## 2024-02-12 MED ORDER — FENTANYL CITRATE (PF) 100 MCG/2ML IJ SOLN: 25.0000 ug | INTRAMUSCULAR | Status: DC | PRN

## 2024-02-12 MED ORDER — ONDANSETRON HCL 4 MG/2ML IJ SOLN
INTRAMUSCULAR | Status: AC
  Filled 2024-02-12: qty 2

## 2024-02-12 MED ORDER — ACETAMINOPHEN 500 MG PO TABS
1000.0000 mg | ORAL_TABLET | ORAL | Status: AC
  Administered 2024-02-12: 1000 mg via ORAL
  Filled 2024-02-12: qty 2

## 2024-02-12 MED ORDER — FENTANYL CITRATE (PF) 250 MCG/5ML IJ SOLN
INTRAMUSCULAR | Status: AC
  Filled 2024-02-12: qty 5

## 2024-02-12 MED ORDER — PROPOFOL 10 MG/ML IV BOLUS
INTRAVENOUS | Status: AC
  Filled 2024-02-12: qty 20

## 2024-02-12 MED ORDER — CELECOXIB 200 MG PO CAPS
200.0000 mg | ORAL_CAPSULE | Freq: Once | ORAL | Status: AC
  Administered 2024-02-12: 200 mg via ORAL
  Filled 2024-02-12: qty 1

## 2024-02-12 MED ORDER — BUPIVACAINE-EPINEPHRINE (PF) 0.25% -1:200000 IJ SOLN
INTRAMUSCULAR | Status: AC
  Filled 2024-02-12: qty 30

## 2024-02-12 MED ORDER — CHLORHEXIDINE GLUCONATE 0.12 % MT SOLN
OROMUCOSAL | Status: AC
  Administered 2024-02-12: 15 mL via OROMUCOSAL
  Filled 2024-02-12: qty 15

## 2024-02-12 MED ORDER — SCOPOLAMINE 1 MG/3DAYS TD PT72
1.0000 | MEDICATED_PATCH | TRANSDERMAL | Status: DC
  Filled 2024-02-12: qty 1

## 2024-02-12 MED ORDER — CHLORHEXIDINE GLUCONATE CLOTH 2 % EX PADS: 6.0000 | MEDICATED_PAD | Freq: Once | CUTANEOUS | Status: DC

## 2024-02-12 MED ORDER — SODIUM CHLORIDE 0.9 % IV SOLN: 12.5000 mg | INTRAVENOUS | Status: DC | PRN

## 2024-02-12 MED ORDER — PHENYLEPHRINE 80 MCG/ML (10ML) SYRINGE FOR IV PUSH (FOR BLOOD PRESSURE SUPPORT)
PREFILLED_SYRINGE | INTRAVENOUS | Status: DC | PRN
  Administered 2024-02-12: 80 ug via INTRAVENOUS
  Administered 2024-02-12: 40 ug via INTRAVENOUS

## 2024-02-12 MED ORDER — ORAL CARE MOUTH RINSE: 15.0000 mL | Freq: Once | OROMUCOSAL | Status: AC

## 2024-02-12 MED ORDER — ONDANSETRON HCL 4 MG/2ML IJ SOLN
INTRAMUSCULAR | Status: DC | PRN
  Administered 2024-02-12: 4 mg via INTRAVENOUS

## 2024-02-12 MED ORDER — LIDOCAINE HCL 1 % IJ SOLN
INTRAMUSCULAR | Status: DC | PRN
  Administered 2024-02-12: 20 mL

## 2024-02-12 MED ORDER — PROPOFOL 10 MG/ML IV BOLUS
INTRAVENOUS | Status: DC | PRN
  Administered 2024-02-12: 200 mg via INTRAVENOUS

## 2024-02-12 MED ORDER — EPHEDRINE SULFATE-NACL 50-0.9 MG/10ML-% IV SOSY
PREFILLED_SYRINGE | INTRAVENOUS | Status: DC | PRN
  Administered 2024-02-12: 7.5 mg via INTRAVENOUS
  Administered 2024-02-12: 5 mg via INTRAVENOUS

## 2024-02-12 MED ORDER — LIDOCAINE 2% (20 MG/ML) 5 ML SYRINGE
INTRAMUSCULAR | Status: DC | PRN
  Administered 2024-02-12: 60 mg via INTRAVENOUS

## 2024-02-12 MED ORDER — CHLORHEXIDINE GLUCONATE 0.12 % MT SOLN: 15.0000 mL | Freq: Once | OROMUCOSAL | Status: AC

## 2024-02-12 MED ORDER — MIDAZOLAM HCL 2 MG/2ML IJ SOLN
INTRAMUSCULAR | Status: AC
  Filled 2024-02-12: qty 2

## 2024-02-12 MED ORDER — AMISULPRIDE (ANTIEMETIC) 5 MG/2ML IV SOLN: 10.0000 mg | Freq: Once | INTRAVENOUS | Status: DC | PRN

## 2024-02-12 MED ORDER — LIDOCAINE HCL (PF) 1 % IJ SOLN
INTRAMUSCULAR | Status: AC
  Filled 2024-02-12: qty 30

## 2024-02-12 MED ORDER — OXYCODONE HCL 5 MG PO TABS
ORAL_TABLET | ORAL | Status: AC
  Filled 2024-02-12: qty 1

## 2024-02-12 MED ORDER — FENTANYL CITRATE (PF) 250 MCG/5ML IJ SOLN
INTRAMUSCULAR | Status: DC | PRN
  Administered 2024-02-12 (×2): 50 ug via INTRAVENOUS

## 2024-02-12 MED ORDER — OXYCODONE HCL 5 MG/5ML PO SOLN: 5.0000 mg | Freq: Once | ORAL | Status: AC | PRN

## 2024-02-12 MED ORDER — LACTATED RINGERS IV SOLN: INTRAVENOUS | Status: DC

## 2024-02-12 MED ORDER — EPHEDRINE 5 MG/ML INJ
INTRAVENOUS | Status: AC
  Filled 2024-02-12: qty 5

## 2024-02-12 MED ORDER — DEXAMETHASONE SODIUM PHOSPHATE 10 MG/ML IJ SOLN
INTRAMUSCULAR | Status: AC
  Filled 2024-02-12: qty 1

## 2024-02-12 MED ORDER — CEFAZOLIN SODIUM-DEXTROSE 2-4 GM/100ML-% IV SOLN
2.0000 g | INTRAVENOUS | Status: AC
  Administered 2024-02-12: 2 g via INTRAVENOUS
  Filled 2024-02-12: qty 100

## 2024-02-12 MED ORDER — OXYCODONE HCL 5 MG PO TABS
5.0000 mg | ORAL_TABLET | Freq: Once | ORAL | Status: AC | PRN
  Administered 2024-02-12: 5 mg via ORAL

## 2024-02-12 MED ORDER — MIDAZOLAM HCL 2 MG/2ML IJ SOLN
INTRAMUSCULAR | Status: DC | PRN
Start: 2024-02-12 — End: 2024-02-12
  Administered 2024-02-12: 2 mg via INTRAVENOUS

## 2024-02-12 MED ORDER — 0.9 % SODIUM CHLORIDE (POUR BTL) OPTIME
TOPICAL | Status: DC | PRN
  Administered 2024-02-12: 1000 mL

## 2024-02-12 MED ORDER — DEXAMETHASONE SODIUM PHOSPHATE 10 MG/ML IJ SOLN
INTRAMUSCULAR | Status: DC | PRN
Start: 2024-02-12 — End: 2024-02-12
  Administered 2024-02-12: 10 mg via INTRAVENOUS

## 2024-02-12 SURGICAL SUPPLY — 31 items
CANISTER SUCTION 3000ML PPV (SUCTIONS) IMPLANT
CHLORAPREP W/TINT 26 (MISCELLANEOUS) ×1 IMPLANT
CLIP TI LARGE 6 (CLIP) ×1 IMPLANT
COVER PROBE W GEL 5X96 (DRAPES) ×1 IMPLANT
COVER SURGICAL LIGHT HANDLE (MISCELLANEOUS) ×1 IMPLANT
DERMABOND ADVANCED .7 DNX12 (GAUZE/BANDAGES/DRESSINGS) ×1 IMPLANT
DEVICE DUBIN SPECIMEN MAMMOGRA (MISCELLANEOUS) ×1 IMPLANT
DRAPE CHEST BREAST 15X10 FENES (DRAPES) ×1 IMPLANT
ELECT COATED BLADE 2.86 ST (ELECTRODE) ×1 IMPLANT
ELECTRODE REM PT RTRN 9FT ADLT (ELECTROSURGICAL) ×1 IMPLANT
GAUZE PAD ABD 8X10 STRL (GAUZE/BANDAGES/DRESSINGS) ×1 IMPLANT
GAUZE SPONGE 4X4 12PLY STRL LF (GAUZE/BANDAGES/DRESSINGS) ×1 IMPLANT
GLOVE BIO SURGEON STRL SZ 6 (GLOVE) ×1 IMPLANT
GLOVE INDICATOR 6.5 STRL GRN (GLOVE) ×1 IMPLANT
GOWN STRL REUS W/ TWL LRG LVL3 (GOWN DISPOSABLE) ×1 IMPLANT
GOWN STRL REUS W/ TWL XL LVL3 (GOWN DISPOSABLE) ×1 IMPLANT
KIT BASIN OR (CUSTOM PROCEDURE TRAY) ×1 IMPLANT
KIT MARKER MARGIN INK (KITS) ×1 IMPLANT
LIGHT WAVEGUIDE WIDE FLAT (MISCELLANEOUS) IMPLANT
NDL HYPO 25GX1X1/2 BEV (NEEDLE) ×1 IMPLANT
NEEDLE HYPO 25GX1X1/2 BEV (NEEDLE) ×1 IMPLANT
NS IRRIG 1000ML POUR BTL (IV SOLUTION) IMPLANT
PACK GENERAL/GYN (CUSTOM PROCEDURE TRAY) ×1 IMPLANT
STRIP CLOSURE SKIN 1/2X4 (GAUZE/BANDAGES/DRESSINGS) ×1 IMPLANT
SUT MNCRL AB 4-0 PS2 18 (SUTURE) ×1 IMPLANT
SUT SILK 2 0 SH (SUTURE) IMPLANT
SUT VIC AB 2-0 SH 27XBRD (SUTURE) IMPLANT
SUT VIC AB 3-0 SH 27X BRD (SUTURE) ×1 IMPLANT
SYR CONTROL 10ML LL (SYRINGE) ×1 IMPLANT
TOWEL GREEN STERILE (TOWEL DISPOSABLE) ×1 IMPLANT
TOWEL GREEN STERILE FF (TOWEL DISPOSABLE) ×1 IMPLANT

## 2024-02-12 NOTE — Anesthesia Postprocedure Evaluation (Signed)
 Anesthesia Post Note  Patient: Colleen Henderson  Procedure(s) Performed: RE EXCISION LEFT BREAST LUMPECTOMY (Left: Breast)     Patient location during evaluation: PACU Anesthesia Type: General Level of consciousness: awake and alert Pain management: pain level controlled Vital Signs Assessment: post-procedure vital signs reviewed and stable Respiratory status: spontaneous breathing, nonlabored ventilation and respiratory function stable Cardiovascular status: stable and blood pressure returned to baseline Anesthetic complications: no   No notable events documented.  Last Vitals:  Vitals:   02/12/24 1300 02/12/24 1317  BP: 132/74 131/75  Pulse: 72 73  Resp: 19 17  Temp:  36.5 C  SpO2: 97% 100%    Last Pain:  Vitals:   02/12/24 1317  TempSrc:   PainSc: 4                  Debby FORBES Like

## 2024-02-12 NOTE — Anesthesia Procedure Notes (Signed)
 Procedure Name: LMA Insertion Date/Time: 02/12/2024 11:42 AM  Performed by: Worth Catherene Flores, CRNAPre-anesthesia Checklist: Patient identified, Emergency Drugs available, Suction available and Patient being monitored Patient Re-evaluated:Patient Re-evaluated prior to induction Oxygen Delivery Method: Circle system utilized Preoxygenation: Pre-oxygenation with 100% oxygen Induction Type: IV induction Ventilation: Mask ventilation without difficulty LMA Size: 4.0 Tube type: Oral Number of attempts: 1 Airway Equipment and Method: Oral airway Placement Confirmation: positive ETCO2 and breath sounds checked- equal and bilateral Tube secured with: Tape Dental Injury: Teeth and Oropharynx as per pre-operative assessment

## 2024-02-12 NOTE — Interval H&P Note (Signed)
 History and Physical Interval Note:  02/12/2024 8:43 AM  Colleen Henderson  has presented today for surgery, with the diagnosis of LEFT BREAST PHYLLODES TUMOR.  The various methods of treatment have been discussed with the patient and family. After consideration of risks, benefits and other options for treatment, the patient has consented to  Procedure(s) with comments: BREAST LUMPECTOMY (Left) - RE EXCISION LEFT BREAST LUMPECTOMY as a surgical intervention.  The patient's history has been reviewed, patient examined, no change in status, stable for surgery.  I have reviewed the patient's chart and labs.  Questions were answered to the patient's satisfaction.     Jina Nephew

## 2024-02-12 NOTE — Anesthesia Preprocedure Evaluation (Addendum)
 Anesthesia Evaluation  Patient identified by MRN, date of birth, ID band Patient awake    Reviewed: Allergy & Precautions, NPO status , Patient's Chart, lab work & pertinent test results  History of Anesthesia Complications Negative for: history of anesthetic complications  Airway Mallampati: II  TM Distance: >3 FB Neck ROM: Full    Dental  (+) Dental Advisory Given, Teeth Intact   Pulmonary neg pulmonary ROS   Pulmonary exam normal        Cardiovascular negative cardio ROS Normal cardiovascular exam     Neuro/Psych negative neurological ROS  negative psych ROS   GI/Hepatic Neg liver ROS,GERD  Medicated and Controlled,,  Endo/Other  Hypothyroidism    Renal/GU negative Renal ROS     Musculoskeletal negative musculoskeletal ROS (+)    Abdominal   Peds  Hematology negative hematology ROS (+)   Anesthesia Other Findings   Reproductive/Obstetrics  Phyllodes tumor                               Anesthesia Physical Anesthesia Plan  ASA: 2  Anesthesia Plan: General   Post-op Pain Management: Tylenol  PO (pre-op)* and Celebrex  PO (pre-op)*   Induction: Intravenous  PONV Risk Score and Plan: 3 and Treatment may vary due to age or medical condition, Ondansetron , Dexamethasone  and Midazolam   Airway Management Planned: LMA  Additional Equipment: None  Intra-op Plan:   Post-operative Plan: Extubation in OR  Informed Consent: I have reviewed the patients History and Physical, chart, labs and discussed the procedure including the risks, benefits and alternatives for the proposed anesthesia with the patient or authorized representative who has indicated his/her understanding and acceptance.     Dental advisory given  Plan Discussed with: CRNA and Anesthesiologist  Anesthesia Plan Comments: (Patient refused scopolamine  patch)         Anesthesia Quick Evaluation

## 2024-02-12 NOTE — Transfer of Care (Signed)
 Immediate Anesthesia Transfer of Care Note  Patient: Colleen Henderson  Procedure(s) Performed: RE EXCISION LEFT BREAST LUMPECTOMY (Left: Breast)  Patient Location: PACU  Anesthesia Type:General  Level of Consciousness: awake, alert , and drowsy  Airway & Oxygen Therapy: Patient Spontanous Breathing and Patient connected to face mask oxygen  Post-op Assessment: Report given to RN and Post -op Vital signs reviewed and stable  Post vital signs: Reviewed and stable  Last Vitals:  Vitals Value Taken Time  BP 128/65 02/12/24 12:47  Temp    Pulse 75 02/12/24 12:50  Resp 18 02/12/24 12:50  SpO2 100 % 02/12/24 12:50  Vitals shown include unfiled device data.  Last Pain:  Vitals:   02/12/24 0917  TempSrc:   PainSc: 0-No pain         Complications: No notable events documented.

## 2024-02-12 NOTE — Discharge Instructions (Addendum)
 [facility] Office Phone Number [phone]  BREAST BIOPSY/ PARTIAL MASTECTOMY: POST OP INSTRUCTIONS  Always review your discharge instruction sheet given to you by the facility where your surgery was performed.  IF YOU HAVE DISABILITY OR FAMILY LEAVE FORMS, YOU MUST BRING THEM TO THE OFFICE FOR PROCESSING.  DO NOT GIVE THEM TO YOUR DOCTOR.  Take 2 tylenol  (acetominophen) three times a day for 3 days.  If you still have pain, add ibuprofen with food in between if able to take this (if you have kidney issues or stomach issues, do not take ibuprofen).  If both of those are not enough, add the narcotic pain pill.  If you find you are needing a lot of this overnight after surgery, call the next morning for a refill.    Prescriptions will not be filled after 5pm or on week-ends. Take your usually prescribed medications unless otherwise directed You should eat very light the first 24 hours after surgery, such as soup, crackers, pudding, etc.  Resume your normal diet the day after surgery. Most patients will experience some swelling and bruising in the breast.  Ice packs and a good support bra will help.  Swelling and bruising can take several days to resolve.  It is common to experience some constipation if taking pain medication after surgery.  Increasing fluid intake and taking a stool softener will usually help or prevent this problem from occurring.  A mild laxative (Milk of Magnesia or Miralax ) should be taken according to package directions if there are no bowel movements after 48 hours. Unless discharge instructions indicate otherwise, you may remove your bandages 48 hours after surgery, and you may shower at that time.  You may have steri-strips (small skin tapes) in place directly over the incision.  These strips should be left on the skin at least for for 7-10 days.    ACTIVITIES:  You may resume regular daily activities (gradually increasing) beginning the next day.  Wearing a  good support bra or sports bra (or the breast binder) minimizes pain and swelling.  You may have sexual intercourse when it is comfortable. No heavy lifting for 1-2 weeks (not over around 10 pounds).  You may drive when you no longer are taking prescription pain medication, you can comfortably wear a seatbelt, and you can safely maneuver your car and apply brakes. RETURN TO WORK:  __________3-14 days depending on job. _______________ Colleen Henderson should see your doctor in the office for a follow-up appointment approximately two weeks after your surgery.  Your doctor's nurse will typically make your follow-up appointment when she calls you with your pathology report.  Expect your pathology report 3-4 business days after your surgery.  You may call to check if you do not hear from us  after three days.   WHEN TO CALL YOUR DOCTOR: Fever over 101.0 Nausea and/or vomiting. Extreme swelling or bruising. Continued bleeding from incision. Increased pain, redness, or drainage from the incision.  The clinic staff is available to answer your questions during regular business hours.  Please don't hesitate to call and ask to speak to one of the nurses for clinical concerns.  If you have a medical emergency, go to the nearest emergency room or call 911.  A surgeon from [facility] is always on call at the hospital.  For further questions, please visit [email or web address]

## 2024-02-12 NOTE — Op Note (Signed)
 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 The risks, benefits, complications, treatment options, and expected outcomes were discussed with the patient. The possibilities of reaction to medication, pulmonary aspiration, bleeding, infection, the need for additional procedures, failure to diagnose a condition, and creating a complication requiring transfusion or operation were discussed with the patient. The patient concurred with the proposed plan, giving informed consent. The site of surgery properly noted/marked. The patient was taken to Operating Room # 4, identified, and the procedure verified as re-excision of left breast phyllodes tumor  After induction of anesthesia, the left breast and chest were prepped and draped in standard fashion.   The reexcision was performed by reopening the prior incision. Seroma was aspirated. The mastopexy sutures were removed. Additional margins were taken at the posterior and lateral borders of the excisional cavity. The margins were marked with the Margin Marker paint kit.   Hemostasis was achieved with cautery. The deep tissues were reapproximated with 2-0 vicryl interrupted sutures. The wound was irrigated and closed with a 3-0 Vicryl deep dermal interrupted and a 4-0 Monocryl subcuticular closure in layers. Sterile dressings were applied. At the end of the operation, all sponge, instrument, and needle counts were correct.   Findings:  grossly clear surgical margins, anterior margin is skin.    Estimated Blood Loss: Minimal   Drains: none    Specimens: additional lateral and posterior margins.   Complications: None; patient tolerated the procedure well.   Disposition: PACU - hemodynamically stable.   Condition: stable

## 2024-02-13 ENCOUNTER — Encounter (HOSPITAL_COMMUNITY): Payer: Self-pay | Admitting: General Surgery

## 2024-02-17 LAB — SURGICAL PATHOLOGY

## 2024-02-18 ENCOUNTER — Ambulatory Visit: Payer: Self-pay | Admitting: General Surgery

## 2024-04-27 ENCOUNTER — Other Ambulatory Visit: Payer: Self-pay | Admitting: General Surgery

## 2024-04-27 DIAGNOSIS — N6321 Unspecified lump in the left breast, upper outer quadrant: Secondary | ICD-10-CM

## 2024-04-27 DIAGNOSIS — D242 Benign neoplasm of left breast: Secondary | ICD-10-CM

## 2024-05-05 ENCOUNTER — Ambulatory Visit
Admission: RE | Admit: 2024-05-05 | Discharge: 2024-05-05 | Disposition: A | Discharge status: Home / Self Care | Source: Ambulatory Visit | Attending: General Surgery | Admitting: General Surgery

## 2024-05-05 ENCOUNTER — Other Ambulatory Visit: Payer: Self-pay | Admitting: General Surgery

## 2024-05-05 DIAGNOSIS — N6321 Unspecified lump in the left breast, upper outer quadrant: Secondary | ICD-10-CM

## 2024-05-05 DIAGNOSIS — D242 Benign neoplasm of left breast: Secondary | ICD-10-CM

## 2024-05-05 MED ORDER — GADOPICLENOL 0.5 MMOL/ML IV SOLN
7.0000 mL | Freq: Once | INTRAVENOUS | Status: AC | PRN
  Administered 2024-05-05: 10:00:00 7 mL via INTRAVENOUS

## 2024-05-06 ENCOUNTER — Telehealth: Payer: Self-pay | Admitting: *Deleted

## 2024-05-06 ENCOUNTER — Other Ambulatory Visit: Payer: Self-pay | Admitting: General Surgery

## 2024-05-06 DIAGNOSIS — N6321 Unspecified lump in the left breast, upper outer quadrant: Secondary | ICD-10-CM

## 2024-05-06 NOTE — Telephone Encounter (Signed)
 Copied from CRM #[id number]. Topic: Clinical - Request for Lab/Test Order >> May 06, 2024 12:24 PM Shereese L wrote: Reason for CRM: Patient is requesting to be tested for thyroid , B-12, CBC and iron. She's wanting to retest for all the test that were abnormal

## 2024-05-12 ENCOUNTER — Inpatient Hospital Stay: Admission: RE | Admit: 2024-05-12 | Discharge: 2024-05-12 | Attending: General Surgery | Admitting: General Surgery

## 2024-05-12 DIAGNOSIS — N6321 Unspecified lump in the left breast, upper outer quadrant: Secondary | ICD-10-CM

## 2024-05-14 ENCOUNTER — Other Ambulatory Visit: Payer: Self-pay | Admitting: Family Medicine

## 2024-05-14 DIAGNOSIS — Z0189 Encounter for other specified special examinations: Secondary | ICD-10-CM

## 2024-05-14 DIAGNOSIS — E039 Hypothyroidism, unspecified: Secondary | ICD-10-CM

## 2024-05-14 DIAGNOSIS — R790 Abnormal level of blood mineral: Secondary | ICD-10-CM

## 2024-05-14 DIAGNOSIS — E538 Deficiency of other specified B group vitamins: Secondary | ICD-10-CM

## 2024-05-14 NOTE — Telephone Encounter (Signed)
 Lab orders placed as requested by pt. I will not be here next week, so she can have lab appt in 05/2024. Thanks, BJ

## 2024-05-14 NOTE — Telephone Encounter (Signed)
 Patient stated she wanted the labs drawn before the new year and her insurance starts over. Patient stated she is okay with getting her lab results delayed because of Christmas break. Appointment scheduled  for 05/17/24

## 2024-05-17 ENCOUNTER — Other Ambulatory Visit (INDEPENDENT_AMBULATORY_CARE_PROVIDER_SITE_OTHER)

## 2024-05-17 DIAGNOSIS — Z0189 Encounter for other specified special examinations: Secondary | ICD-10-CM | POA: Diagnosis not present

## 2024-05-17 DIAGNOSIS — E538 Deficiency of other specified B group vitamins: Secondary | ICD-10-CM

## 2024-05-17 DIAGNOSIS — E039 Hypothyroidism, unspecified: Secondary | ICD-10-CM

## 2024-05-17 DIAGNOSIS — R790 Abnormal level of blood mineral: Secondary | ICD-10-CM

## 2024-05-17 LAB — CBC
HCT: 37.6 % (ref 36.0–46.0)
Hemoglobin: 12.6 g/dL (ref 12.0–15.0)
MCHC: 33.4 g/dL (ref 30.0–36.0)
MCV: 85.9 fl (ref 78.0–100.0)
Platelets: 342 K/uL (ref 150.0–400.0)
RBC: 4.38 Mil/uL (ref 3.87–5.11)
RDW: 15 % (ref 11.5–15.5)
WBC: 4.6 K/uL (ref 4.0–10.5)

## 2024-05-17 LAB — FERRITIN: Ferritin: 5.8 ng/mL — ABNORMAL LOW (ref 10.0–291.0)

## 2024-05-17 LAB — TSH: TSH: 1.56 u[IU]/mL (ref 0.35–5.50)

## 2024-05-17 LAB — VITAMIN B12: Vitamin B-12: 235 pg/mL (ref 211–911)

## 2024-05-17 LAB — IRON: Iron: 77 ug/dL (ref 42–145)

## 2024-05-21 ENCOUNTER — Ambulatory Visit: Payer: Self-pay | Admitting: Family Medicine

## 2024-05-24 NOTE — Telephone Encounter (Signed)
 Patient was informed of her lab results. Patient voiced understanding. Patient did not have any further questions or concerns at this time.
